# Patient Record
Sex: Male | Born: 1980 | Race: Black or African American | Hispanic: No | Marital: Single | Smoking: Never smoker
Health system: Southern US, Community
[De-identification: ages and names within clinical notes are randomized; demographics above are authoritative.]

## PROBLEM LIST (undated history)

## (undated) DIAGNOSIS — I10 Essential (primary) hypertension: Secondary | ICD-10-CM

## (undated) DIAGNOSIS — IMO0001 Reserved for inherently not codable concepts without codable children: Secondary | ICD-10-CM

## (undated) HISTORY — PX: TONSILLECTOMY: SUR1361

---
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
## 2002-01-12 ENCOUNTER — Encounter: Payer: Self-pay | Admitting: Emergency Medicine

## 2002-01-12 ENCOUNTER — Emergency Department (HOSPITAL_COMMUNITY): Admission: EM | Admit: 2002-01-12 | Discharge: 2002-01-12 | Payer: Self-pay | Admitting: Emergency Medicine

## 2002-09-12 ENCOUNTER — Emergency Department (HOSPITAL_COMMUNITY): Admission: EM | Admit: 2002-09-12 | Discharge: 2002-09-12 | Payer: Self-pay | Admitting: *Deleted

## 2002-09-12 ENCOUNTER — Encounter: Payer: Self-pay | Admitting: *Deleted

## 2004-01-08 ENCOUNTER — Emergency Department (HOSPITAL_COMMUNITY): Admission: EM | Admit: 2004-01-08 | Discharge: 2004-01-08 | Payer: Self-pay | Admitting: Emergency Medicine

## 2004-01-09 ENCOUNTER — Emergency Department (HOSPITAL_COMMUNITY): Admission: EM | Admit: 2004-01-09 | Discharge: 2004-01-09 | Payer: Self-pay | Admitting: Emergency Medicine

## 2004-10-03 ENCOUNTER — Emergency Department (HOSPITAL_COMMUNITY): Admission: EM | Admit: 2004-10-03 | Discharge: 2004-10-03 | Payer: Self-pay | Admitting: Emergency Medicine

## 2005-03-23 ENCOUNTER — Emergency Department (HOSPITAL_COMMUNITY): Admission: EM | Admit: 2005-03-23 | Discharge: 2005-03-24 | Payer: Self-pay | Admitting: Emergency Medicine

## 2005-06-18 ENCOUNTER — Emergency Department (HOSPITAL_COMMUNITY): Admission: EM | Admit: 2005-06-18 | Discharge: 2005-06-19 | Payer: Self-pay | Admitting: Emergency Medicine

## 2005-08-17 ENCOUNTER — Emergency Department (HOSPITAL_COMMUNITY): Admission: EM | Admit: 2005-08-17 | Discharge: 2005-08-17 | Payer: Self-pay | Admitting: Emergency Medicine

## 2005-08-20 ENCOUNTER — Emergency Department (HOSPITAL_COMMUNITY): Admission: EM | Admit: 2005-08-20 | Discharge: 2005-08-20 | Payer: Self-pay | Admitting: Emergency Medicine

## 2006-04-30 ENCOUNTER — Emergency Department (HOSPITAL_COMMUNITY): Admission: EM | Admit: 2006-04-30 | Discharge: 2006-04-30 | Payer: Self-pay | Admitting: Emergency Medicine

## 2006-05-01 ENCOUNTER — Emergency Department: Payer: Self-pay | Admitting: Emergency Medicine

## 2006-05-02 ENCOUNTER — Emergency Department (HOSPITAL_COMMUNITY): Admission: EM | Admit: 2006-05-02 | Discharge: 2006-05-02 | Payer: Self-pay | Admitting: Emergency Medicine

## 2006-05-04 ENCOUNTER — Emergency Department (HOSPITAL_COMMUNITY): Admission: EM | Admit: 2006-05-04 | Discharge: 2006-05-05 | Payer: Self-pay | Admitting: Emergency Medicine

## 2006-06-01 ENCOUNTER — Emergency Department (HOSPITAL_COMMUNITY): Admission: EM | Admit: 2006-06-01 | Discharge: 2006-06-01 | Payer: Self-pay | Admitting: Emergency Medicine

## 2006-06-30 ENCOUNTER — Emergency Department (HOSPITAL_COMMUNITY): Admission: EM | Admit: 2006-06-30 | Discharge: 2006-07-01 | Payer: Self-pay | Admitting: Emergency Medicine

## 2006-09-06 ENCOUNTER — Emergency Department (HOSPITAL_COMMUNITY): Admission: EM | Admit: 2006-09-06 | Discharge: 2006-09-06 | Payer: Self-pay | Admitting: Emergency Medicine

## 2006-10-06 ENCOUNTER — Emergency Department (HOSPITAL_COMMUNITY): Admission: EM | Admit: 2006-10-06 | Discharge: 2006-10-07 | Payer: Self-pay | Admitting: Emergency Medicine

## 2006-11-17 ENCOUNTER — Emergency Department (HOSPITAL_COMMUNITY): Admission: EM | Admit: 2006-11-17 | Discharge: 2006-11-17 | Payer: Self-pay | Admitting: Emergency Medicine

## 2007-02-10 ENCOUNTER — Emergency Department (HOSPITAL_COMMUNITY): Admission: EM | Admit: 2007-02-10 | Discharge: 2007-02-10 | Payer: Self-pay | Admitting: Emergency Medicine

## 2007-03-22 ENCOUNTER — Observation Stay (HOSPITAL_COMMUNITY): Admission: EM | Admit: 2007-03-22 | Discharge: 2007-03-23 | Payer: Self-pay | Admitting: Emergency Medicine

## 2007-03-22 ENCOUNTER — Encounter (INDEPENDENT_AMBULATORY_CARE_PROVIDER_SITE_OTHER): Payer: Self-pay | Admitting: *Deleted

## 2007-03-22 ENCOUNTER — Ambulatory Visit: Payer: Self-pay | Admitting: Cardiology

## 2007-05-10 ENCOUNTER — Emergency Department (HOSPITAL_COMMUNITY): Admission: EM | Admit: 2007-05-10 | Discharge: 2007-05-10 | Payer: Self-pay | Admitting: Emergency Medicine

## 2007-09-05 ENCOUNTER — Emergency Department (HOSPITAL_COMMUNITY): Admission: EM | Admit: 2007-09-05 | Discharge: 2007-09-05 | Payer: Self-pay | Admitting: Emergency Medicine

## 2008-06-02 ENCOUNTER — Emergency Department (HOSPITAL_COMMUNITY): Admission: EM | Admit: 2008-06-02 | Discharge: 2008-06-02 | Payer: Self-pay | Admitting: Emergency Medicine

## 2008-06-04 ENCOUNTER — Emergency Department (HOSPITAL_COMMUNITY): Admission: EM | Admit: 2008-06-04 | Discharge: 2008-06-04 | Payer: Self-pay | Admitting: Emergency Medicine

## 2008-07-15 ENCOUNTER — Emergency Department (HOSPITAL_COMMUNITY): Admission: EM | Admit: 2008-07-15 | Discharge: 2008-07-15 | Payer: Self-pay | Admitting: Emergency Medicine

## 2008-08-09 ENCOUNTER — Emergency Department: Payer: Self-pay | Admitting: Internal Medicine

## 2008-08-12 ENCOUNTER — Emergency Department (HOSPITAL_COMMUNITY): Admission: EM | Admit: 2008-08-12 | Discharge: 2008-08-12 | Payer: Self-pay | Admitting: Emergency Medicine

## 2008-09-29 ENCOUNTER — Emergency Department (HOSPITAL_COMMUNITY): Admission: EM | Admit: 2008-09-29 | Discharge: 2008-09-29 | Payer: Self-pay | Admitting: Emergency Medicine

## 2008-10-04 ENCOUNTER — Emergency Department: Payer: Self-pay | Admitting: Emergency Medicine

## 2008-10-09 ENCOUNTER — Emergency Department: Payer: Self-pay | Admitting: Internal Medicine

## 2009-01-30 ENCOUNTER — Emergency Department: Payer: Self-pay | Admitting: Emergency Medicine

## 2009-02-13 ENCOUNTER — Emergency Department (HOSPITAL_COMMUNITY): Admission: EM | Admit: 2009-02-13 | Discharge: 2009-02-13 | Payer: Self-pay | Admitting: Emergency Medicine

## 2009-02-13 ENCOUNTER — Encounter: Payer: Self-pay | Admitting: Internal Medicine

## 2009-02-18 ENCOUNTER — Emergency Department: Payer: Self-pay | Admitting: Emergency Medicine

## 2009-03-01 ENCOUNTER — Emergency Department: Payer: Self-pay | Admitting: Emergency Medicine

## 2009-04-07 ENCOUNTER — Emergency Department: Payer: Self-pay | Admitting: Internal Medicine

## 2009-04-13 ENCOUNTER — Emergency Department: Payer: Self-pay | Admitting: Emergency Medicine

## 2009-05-16 ENCOUNTER — Emergency Department (HOSPITAL_COMMUNITY): Admission: EM | Admit: 2009-05-16 | Discharge: 2009-05-16 | Payer: Self-pay | Admitting: Emergency Medicine

## 2009-05-30 ENCOUNTER — Emergency Department: Payer: Self-pay | Admitting: Emergency Medicine

## 2009-06-05 ENCOUNTER — Emergency Department (HOSPITAL_COMMUNITY): Admission: EM | Admit: 2009-06-05 | Discharge: 2009-06-05 | Payer: Self-pay | Admitting: Emergency Medicine

## 2009-06-19 DIAGNOSIS — K219 Gastro-esophageal reflux disease without esophagitis: Secondary | ICD-10-CM

## 2009-06-19 DIAGNOSIS — E669 Obesity, unspecified: Secondary | ICD-10-CM

## 2009-06-19 DIAGNOSIS — I10 Essential (primary) hypertension: Secondary | ICD-10-CM

## 2009-06-20 ENCOUNTER — Ambulatory Visit: Payer: Self-pay | Admitting: Internal Medicine

## 2009-06-20 DIAGNOSIS — R002 Palpitations: Secondary | ICD-10-CM

## 2010-05-06 NOTE — Assessment & Plan Note (Signed)
Summary: np6/chest pain/self refer/jss   Visit Type:  new pt visit Primary Provider:  [facility]  CC:  palpitations.  History of Present Illness: Patient is a 30 year old who is self referred for evaluation of palpitations. the patient has had a long history of palpitations.  He was seen remotely by T. Wall several years ago when he was in the hospital.  He has also been evaluated at [facility].  An echo in the past was done which showed normal LV function.  Per his report  he is doing better on metoprolol.  He denies dizziness, no significant chest pain.  The palpitations are isolated and again, on metoprolol he is not bothered by them.  Current Medications (verified): 1)  Metoprolol Tartrate 25 Mg Tabs (Metoprolol Tartrate) .Marland Kitchen.. 1 Tab Qam..2 Tabs At Bedtime 2)  Nexium 40 Mg Cpdr (Esomeprazole Magnesium) .Marland Kitchen.. 1 Tab Once Daily 3)  Tylenol Extra Strength 500 Mg Tabs (Acetaminophen) .Marland Kitchen.. 1 Tab As Needed  Allergies (verified): No Known Drug Allergies  Past History:  Past Medical History: Current Problems:  OBESITY, UNSPECIFIED (ICD-278.00) ACID REFLUX DISEASE (ICD-530.81) HYPERTENSION (ICD-401.9) Palpitations Chest pain  Social History: non-smoker, non drinker , no drug abuse   Review of Systems       All systems reviewed.  Negatvie to the above problem except as noted above.  Vital Signs:  Patient profile:   30 year old male Height:      72 inches Weight:      540 pounds BMI:     73.50 Pulse rate:   100 / minute Pulse rhythm:   irregular BP sitting:   160 / 80  (left arm) Cuff size:   large  Vitals Entered By: Danielle Rankin, CMA (June 20, 2009 2:44 PM)  Physical Exam  Additional Exam:  Patent is a morbidly obese 30 year old in NAD HEENT:  Normocephalic, atraumatic. EOMI, PERRLA.  Neck: JVP is normal.  No bruits.  Lungs: clear to auscultation. No rales no wheezes.  Heart: Regular rate and rhythm. Normal S1, S2. No S3.   No significant murmurs. Distant.  Abdomen:  Distended.  Non tender.  No obvious masses.  Extremities:   Good distal pulses throughout. No lower extremity edema.  Musculoskeletal :moving all extremities.  Neuro:   alert and oriented x3.    EKG  Procedure date:  06/20/2009  Findings:      NSR. 100 bpm.  Impression & Recommendations:  Problem # 1:  PALPITATIONS (ICD-785.1) By review of the record he  has had a long standing history of isolated PVCs.  No sustained arrhythmia.  Normal LV I would continue metoprolol.  He is not having problems now.    Problem # 2:  HYPERTENSION (ICD-401.9) BP is high today.  I have suggested that he take 2 metoprolol in AM and 1 in PM.  Will need f/u.  He has been told he has sleep apnea.  He is not using CPAP,  which would probably help. His updated medication list for this problem includes:    Metoprolol Tartrate 25 Mg Tabs (Metoprolol tartrate) .Marland Kitchen... 1 tab qam..2 tabs at bedtime  Problem # 3:  OBESITY, UNSPECIFIED (ICD-278.00) I encouraged him to lose wt.  He is consdering bariatric surgery.  I would recomm.  He has failed ther wt loss attempts.  From a cardiac standpt he should tolerate.  Patient Instructions: 1)  Continue current meds.  Can change metoprolol to 2 in AM and 1 in PM.  Will need f/u of bp in primary care. 2)  F/u cardiology as needed if palpitations cause symptoms.

## 2010-06-25 LAB — URINALYSIS, ROUTINE W REFLEX MICROSCOPIC
Ketones, ur: NEGATIVE mg/dL
Nitrite: NEGATIVE
Protein, ur: 100 mg/dL — AB
Specific Gravity, Urine: 1.027 (ref 1.005–1.030)
Urobilinogen, UA: 1 mg/dL (ref 0.0–1.0)
pH: 6 (ref 5.0–8.0)

## 2010-06-25 LAB — URINE MICROSCOPIC-ADD ON

## 2010-06-27 LAB — COMPREHENSIVE METABOLIC PANEL
AST: 14 U/L (ref 0–37)
BUN: 10 mg/dL (ref 6–23)
CO2: 29 mEq/L (ref 19–32)
Creatinine, Ser: 0.84 mg/dL (ref 0.4–1.5)
GFR calc Af Amer: >60 mL/min (ref >60)
Glucose, Bld: 112 mg/dL — ABNORMAL HIGH (ref 70–99)
Potassium: 4.2 mEq/L (ref 3.5–5.1)
Sodium: 139 mEq/L (ref 135–145)
Total Protein: 6.8 g/dL (ref 6.0–8.3)

## 2010-06-27 LAB — DIFFERENTIAL
Basophils Relative: 1 % (ref 0–1)
Eosinophils Absolute: 0.2 10*3/uL (ref 0.0–0.7)
Monocytes Absolute: 0.5 10*3/uL (ref 0.1–1.0)
Monocytes Relative: 7 % (ref 3–12)
Neutro Abs: 3.9 10*3/uL (ref 1.7–7.7)
Neutrophils Relative %: 54 % (ref 43–77)

## 2010-07-09 LAB — DIFFERENTIAL
Basophils Absolute: 0.1 10*3/uL (ref 0.0–0.1)
Eosinophils Absolute: 0.2 10*3/uL (ref 0.0–0.7)
Eosinophils Relative: 3 % (ref 0–5)
Lymphocytes Relative: 41 % (ref 12–46)
Neutrophils Relative %: 47 % (ref 43–77)

## 2010-07-09 LAB — POCT CARDIAC MARKERS: Troponin i, poc: <0.05 ng/mL (ref 0.00–0.09)

## 2010-07-09 LAB — CBC
Hemoglobin: 13.1 g/dL (ref 13.0–17.0)
MCHC: 32.3 g/dL (ref 30.0–36.0)
MCV: 79.4 fL (ref 78.0–100.0)
RBC: 5.11 MIL/uL (ref 4.22–5.81)
RDW: 16.4 % — ABNORMAL HIGH (ref 11.5–15.5)
WBC: 6.4 10*3/uL (ref 4.0–10.5)

## 2010-07-09 LAB — BASIC METABOLIC PANEL
Calcium: 9.5 mg/dL (ref 8.4–10.5)
Chloride: 100 mEq/L (ref 96–112)
GFR calc non Af Amer: >60 mL/min (ref >60)
Glucose, Bld: 122 mg/dL — ABNORMAL HIGH (ref 70–99)

## 2010-07-14 LAB — POCT CARDIAC MARKERS: Myoglobin, poc: 184 ng/mL (ref 12–200)

## 2010-07-14 LAB — DIFFERENTIAL
Basophils Absolute: 0.1 10*3/uL (ref 0.0–0.1)
Basophils Relative: 1 % (ref 0–1)
Eosinophils Absolute: 0.2 10*3/uL (ref 0.0–0.7)
Neutrophils Relative %: 49 % (ref 43–77)

## 2010-07-14 LAB — CBC
MCHC: 32.2 g/dL (ref 30.0–36.0)
MCV: 79.7 fL (ref 78.0–100.0)
Platelets: 304 10*3/uL (ref 150–400)

## 2010-07-14 LAB — BASIC METABOLIC PANEL
BUN: 10 mg/dL (ref 6–23)
CO2: 29 mEq/L (ref 19–32)
Chloride: 105 mEq/L (ref 96–112)
Creatinine, Ser: 0.94 mg/dL (ref 0.4–1.5)

## 2010-07-15 LAB — URINALYSIS, ROUTINE W REFLEX MICROSCOPIC
Bilirubin Urine: NEGATIVE
Glucose, UA: NEGATIVE mg/dL
Hgb urine dipstick: NEGATIVE
Ketones, ur: NEGATIVE mg/dL
Nitrite: NEGATIVE
Protein, ur: NEGATIVE mg/dL
Specific Gravity, Urine: 1.025 (ref 1.005–1.030)
Urobilinogen, UA: 0.2 mg/dL (ref 0.0–1.0)
pH: 6 (ref 5.0–8.0)

## 2010-07-15 LAB — URINE CULTURE
Colony Count: NO GROWTH
Culture: NO GROWTH

## 2010-07-16 LAB — POCT I-STAT, CHEM 8
BUN: 9 mg/dL (ref 6–23)
Calcium, Ion: 1.13 mmol/L (ref 1.12–1.32)
Chloride: 104 mEq/L (ref 96–112)
Creatinine, Ser: 0.8 mg/dL (ref 0.4–1.5)
Glucose, Bld: 109 mg/dL — ABNORMAL HIGH (ref 70–99)
HCT: 40 % (ref 39.0–52.0)
Hemoglobin: 13.6 g/dL (ref 13.0–17.0)
Potassium: 4.1 mEq/L (ref 3.5–5.1)
Sodium: 138 mEq/L (ref 135–145)
TCO2: 28 mmol/L (ref 0–100)

## 2010-08-19 NOTE — H&P (Signed)
NAME:  Nathaniel Velasquez, Nathaniel Velasquez                ACCOUNT NO.:  [id number]   MEDICAL RECORD NO.:  [id number]          PATIENT TYPE:  INP   LOCATION:  4732                         FACILITY:  [facility]   PHYSICIAN:  Wilson Singer, M.D.DATE OF BIRTH:  [birth year]   DATE OF ADMISSION:  03/22/2007  DATE OF DISCHARGE:                              HISTORY & PHYSICAL   HISTORY:  This is a 30 year old man who gives a two-week history of  intermittent chest tightness at rest, for the last two weeks.  The  episodes usually last for 30 minutes.  Interestingly enough, the chest  tightness does not seem to appear on exertion.  He denies any trauma to  his chest.  There is no significant cough or fever.  He has been seen in  the emergency room on more than one occasion for chest pain, and it was  felt that he had costochondritis.  He does have a history of  hypertension and he is morbidly obese.   PAST MEDICAL HISTORY:  1. Hypertension.  2. Sleep apnea.   PAST SURGICAL HISTORY:  No operations.   SOCIAL HISTORY:  He is single.  Lives with his mother.  He does not  smoke.  Does not drink alcohol.  He works at a nursing home, Energy manager.   CURRENT MEDICATIONS:  Norvasc 5 mg daily.   ALLERGIES:  PROFED, which produces hives.   PHYSICAL EXAMINATION:  VITAL SIGNS:  Temperature 97.1 degrees, blood  pressure 149/71, pulse 78, saturation 100%, respirations 12-14.  The  patient weighs 530 pounds, according to his most recent weight.  CARDIOVASCULAR:  Heart sounds are present and normal, without a gallop  rhythm.  There are no murmurs.  LUNGS:  Lung fields are clear.  ABDOMEN:  Soft, nontender with no hepatosplenomegaly.  NEUROLOGIC:  Alert and oriented, without any focal neurological signs.   INVESTIGATIONS:  Electrocardiogram shows some T-wave changes in the  inferior leads which are new, compared to an electrocardiogram done in  August of this year, but there are no acute ST-T elevations.   Troponin less than 0.05.  Sodium 138, potassium 3.9, bicarbonate 27,  glucose 98, BUN 10, creatinine 0.88.  Hemoglobin 12.5, white blood cell  count 10.1, platelets 353.   Chest x-ray was unremarkable and essentially normal.   IMPRESSION:  1. Chest tightness, rule out ischemia.  2. Hypertension.  3. Morbid obesity.  4. Sleep apnea.   PLAN:  1. Admit.  2. Serial cardiac enzymes and electrocardiograms.  3. Consider a cardiology consultation.  4. Dietary consultation with the need to try to lose weight, which is      his main problem.  5. Further recommendations will depend upon the patient's hospital      progress.      Wilson Singer, M.D.  Electronically Signed     NCG/MEDQ  D:  03/22/2007  T:  03/22/2007  Job:  [id number]

## 2010-08-19 NOTE — Consult Note (Signed)
NAME:  Nathaniel Velasquez, Nathaniel Velasquez                ACCOUNT NO.:  [id number]   MEDICAL RECORD NO.:  [id number]          PATIENT TYPE:  INP   LOCATION:  4732                         FACILITY:  [facility]   PHYSICIAN:  Jesse Sans. Wall, MD, FACCDATE OF BIRTH:  15-Aug-1980   DATE OF CONSULTATION:  03/22/2007  DATE OF DISCHARGE:                                 CONSULTATION   PRIMARY CARE PHYSICIAN:  Dr. Marline Backbone at [facility] in [place].   PRIMARY CARDIOLOGIST:  New.   CHIEF COMPLAINT:  Chest pain/abnormal EKG.   HISTORY OF PRESENT ILLNESS:  Nathaniel Velasquez is a 30 year old male with no  known history of coronary artery disease.  He complains of about a 10-  day history of almost constant chest pain.  He has had some exertional  symptoms but he has had more frequent symptoms that occur at rest.  He  feels that his symptoms are helped by him being in an upright position.  He describes the pain is a squeezing or a heaviness.  It starts in his  throat and goes down to the epigastric region.  Occasionally it is  associated with shortness of breath and diaphoresis.  He is also had  nausea with this fairly often.  He feels that his symptoms ease off  after he takes his blood pressure medication and stay that way for  awhile but then come back.  A couple of times it has kept him awake.  The pain range is a 5-8/10.  Occasionally he is pain-free but feels that  he is having pain most of the time.  Currently he states that the pain  is a 4 or 5/10.  It is the same pain that he has had in the midline of  his chest.  He has not tried any medications such as Tums or antacids  for this.  Prior to 10 days ago he had not experienced this pain.   PAST MEDICAL HISTORY:  1. Hypertension.  2. Morbid obesity.  3. Recently-diagnosed hyperlipidemia.  4. He has no history of diabetes or family history of coronary artery      disease.  5. Obstructive sleep apnea but he is noncompliant with CPAP  secondary      to headache.   SURGICAL HISTORY:  Tonsillectomy.   ALLERGIES:  PROFED.   Medications prior to admission were Norvasc 5 mg a day and now and  include DVT Lovenox at 120 mg daily.   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  He feels that he  is active with hunting and cutting and splinting wood because they heat  with wood.  He also helps out his neighbors cutting wood for them.   FAMILY HISTORY:  His mother is in her 63s and his father is in his 110s,  and neither one has a history of coronary artery disease.  He has no  siblings with CAD.   REVIEW OF SYSTEMS:  He had a headache secondary to CPAP  but this  resolved when he quit using it.  The chest pain and shortness of breath  are described above.  He denies dyspnea on exertion and states in order  to hunt he was climbing a tree the other day without significant  difficulty.  He states that he feels congested and feels that if he  could cough up something his pain would resolve, but he has coughed  occasionally but not coughed up anything except a little bit of white  phlegm.  He denies syncope or presyncope.  He denies claudication  symptoms or wheezing.  He has no palpitations.  Because of his morbid  obesity, he is being referred for consideration of gastric bypass.  He  says that he has occasional reflux symptoms but denies abdominal pain,  hematemesis, hemoptysis or melena.  He does not feel that these symptoms  are reflux.  A full 14-point review of systems is otherwise negative.   PHYSICAL EXAM:  VITAL SIGNS:  Temperature is 97.8, blood pressure  150/83, pulse 50, respiratory rate 24, O2 saturation 98% on 2 L.  Weight  is 242.6 kg, which is 533.7 pounds.  GENERAL:  He is a well-developed, obese African American male in no  acute distress.  HEENT:  Normal.  NECK:  There is no lymphadenopathy, thyromegaly, bruit or JVD noted.   CV:  His heart is regular in rate and rhythm with an S1, S2, and no  significant murmur, rub or gallop is noted.  Distal pulses are intact in  all four extremities.  LUNGS:  Clear to auscultation bilaterally.  SKIN:  No rashes or lesions are noted.  ABDOMEN:  Soft and nontender with active bowel sounds.  EXTREMITIES:  He has trace to 1+ edema bilaterally but no cyanosis or  clubbing is noted.  MUSCULOSKELETAL:  There is no joint deformity or effusion and no spine  or CVA tenderness.  NEUROLOGIC:  He is alert and oriented.  Cranial nerves II-XII grossly  intact.   Chest x-ray:  No acute disease.   EKG is sinus rhythm, rate 76, with inferior T-wave changes that are  slight but present compared to an EKG dated August 2008.   LABORATORY VALUES:  Hemoglobin 12.5, hematocrit 38, WBC 7.1, platelets  331.  Sodium 138, potassium 3.9, chloride 105, CO2 27, BUN 10,  creatinine 0.88, glucose 98.  Point of care markers negative x1.  CK, MB  and troponin I negative x2.  Hemoglobin A1c 5.8.  Total cholesterol 179,  triglycerides 98, HDL 25 LDL 134.  D-dimer less than 0.22.   IMPRESSION:  Chest pain:  He has noncardiac chest pain by careful  history as it is constant and frequently worse at rest.  He also has  deconditioning which is chronic and unchanged recently and secondary to  his weight and deconditioning.  Cardiac CT was considered but cannot be  performed here.  An echocardiogram has been ordered and results are  pending at the time of dictation.  If the echocardiogram is unhelpful  secondary to technical difficulties, a MUGA scan can be performed.  If  his EF is normal and there are no wall motion abnormalities, no further  cardiac workup is indicated at this time.  A long discussion was held by  Dr. Daleen Squibb with the patient and his mother on cardiac risk factor  reduction.  He is appropriately being referred for weight loss and  bariatric surgery.     Theodore Demark, PA-C       Jesse Sans.  Daleen Squibb, MD, [facility]  Electronically Signed   RB/MEDQ  D:  03/22/2007  T:  03/23/2007  Job:  [id number]   cc:   Marline Backbone, MD

## 2010-08-19 NOTE — Discharge Summary (Signed)
[name], [name] NO.:  [id number]   MEDICAL RECORD NO.:  [id number]          PATIENT TYPE:  INP   LOCATION:  4732                         FACILITY:  [facility]   PHYSICIAN:  Lucita Ferrara, MD         DATE OF BIRTH:  22-Oct-1980   DATE OF ADMISSION:  03/22/2007  DATE OF DISCHARGE:  03/23/2007                               DISCHARGE SUMMARY   ADMISSION DIAGNOSES:  1. Chest tightness, rule out ischemia.  2. Hypertension.  3. Morbid obesity.  4. Sleep apnea.   DISCHARGE DIAGNOSES:  1. Chest tightness, ischemia ruled out.  2. Hypertension, chronic, stable.  3. Morbid obesity, chronic, ongoing problem.  4. Sleep apnea, noncompliant on continuous positive airway pressure.   PROCEDURES:  The patient had a 2-D echocardiogram March 22, 2007,  which showed __________ to be technically limited but overall left  ventricular function is not significantly reduced.  Left ventricular  wall might be mildly increased.  Right ventricle is normal.   CONSULTANTS:  [facility].   BRIEF HISTORY OF PRESENT ILLNESS:  Nathaniel Velasquez is a 30 year old  morbidly obese patient who presents to [facility] on  March 22, 2007, after having chest tightness at rest that has been  going on for the last 2 or 3 weeks, lasting 30 minutes.  No orthopnea or  paroxysmal nocturnal dyspnea.  No worsening of the symptom upon  exertion.  He was admitted for chest tightness and serial enzymes x3.  Electrocardiogram was within normal limits.  Cardiology was consulted  and they recommended the patient to get an echocardiogram.  Stress test  was not advised.  Also risk factor modification with lipid-lowering  agents.  A fasting lipid profile was checked and overall LDL was  increased at 134.   Problem 2.  HISTORY OF SLEEP APNEA, NONCOMPLIANT WITH CONTINUOUS  POSITIVE AIRWAY PRESSURE:  Apparently the patient has been evaluated in  [facility].  The patient is noncompliant with CPAP  as he cannot  tolerate it at night.  The patient was advised and encouraged to  continue on his CPAP.  Overall overnight pulse oximetry did not  desaturate.   Problem 3.  MORBID OBESITY:  Nutrition was called and the patient had a  long discussion in regard to diet and exercise and potential outpatient  referral for weight-lowering surgery including banding versus gastric  bypass.  The patient is still pretty contemplative in regard to this and  has not been evaluated by a certified nutritionist/dietetic clinic.  I  advised him and urged him to continue on with this.   On day of discharge the patient is hemodynamically stable.  VITAL SIGNS:  Temperature 98, pulse 69, respirations 20, blood pressure  150/89, pulse oximetry 97% on room air.  HEENT:  Normocephalic, atraumatic.  Sclerae anicteric.  NECK:  Supple.  No JVD or carotid bruits.  CARDIOVASCULAR:  S1, S2, regular rate and rhythm.  No murmurs, rubs or  clicks.  ABDOMEN:  Soft, not tender, not distended, positive bowel sounds.  CHEST:  Clear to auscultation bilaterally.  No  rhonchi, rales or  wheezes.  EXTREMITIES:  No clubbing, cyanosis, or edema.  Pulse oximetry 97% on  room air.   The patient will be discharged on:  1. Aspirin 81 mg p.o. daily.  2. Norvasc 10 mg p.o. daily.      Lucita Ferrara, MD  Electronically Signed     RR/MEDQ  D:  03/23/2007  T:  03/23/2007  Job:  [id number]

## 2010-08-22 NOTE — Procedures (Signed)
   NAME:  Nathaniel Velasquez, Nathaniel Velasquez                          ACCOUNT NO.:  [id number]   MEDICAL RECORD NO.:  [id number]                   PATIENT TYPE:  EMS   LOCATION:  ED                                   FACILITY:  [facility]   PHYSICIAN:  Edward L. Juanetta Gosling, M.D.             DATE OF BIRTH:  1981-02-22   DATE OF PROCEDURE:  01/12/2002  DATE OF DISCHARGE:  01/12/2002                                EKG INTERPRETATION   DATE AND TIME OF TEST:  January 12, 2002 at 1701.   IMPRESSION:  The rhythm is sinus rhythm with a rate in the 80s.  There are  small inferior Q waves and there are mild ST abnormalities inferiorly and  laterally.                                               Edward L. Juanetta Gosling, M.D.    ELH/MEDQ  D:  01/14/2002  T:  01/16/2002  Job:  [id number]

## 2010-10-13 ENCOUNTER — Emergency Department: Payer: Self-pay | Admitting: Emergency Medicine

## 2010-12-26 LAB — DIFFERENTIAL
Lymphocytes Relative: 46
Lymphs Abs: 4.5 — ABNORMAL HIGH
Neutro Abs: 4.2
Neutrophils Relative %: 44

## 2010-12-26 LAB — POCT CARDIAC MARKERS
CKMB, poc: 2.1
Myoglobin, poc: 89
Operator id: 198161
Operator id: 198161
Troponin i, poc: <0.05

## 2010-12-26 LAB — BASIC METABOLIC PANEL
BUN: 11
Calcium: 9.1
Creatinine, Ser: 0.84
GFR calc non Af Amer: >60
Potassium: 3.9

## 2010-12-26 LAB — B-NATRIURETIC PEPTIDE (CONVERTED LAB): Pro B Natriuretic peptide (BNP): <30

## 2010-12-26 LAB — CBC
HCT: 39.3
Platelets: 345
WBC: 9.7

## 2011-01-01 LAB — STREP A DNA PROBE: Group A Strep Probe: NEGATIVE

## 2011-01-09 LAB — COMPREHENSIVE METABOLIC PANEL
ALT: 17
AST: 18
Albumin: 3.4 — ABNORMAL LOW
Alkaline Phosphatase: 62
BUN: 7
CO2: 28
Calcium: 9.3
Chloride: 102
Creatinine, Ser: 0.8
GFR calc Af Amer: >60
GFR calc non Af Amer: >60
Glucose, Bld: 91
Potassium: 4.1
Sodium: 139
Total Bilirubin: 0.7
Total Protein: 7.3

## 2011-01-09 LAB — POCT CARDIAC MARKERS
CKMB, poc: 1.4
CKMB, poc: 2.6
Myoglobin, poc: 102
Myoglobin, poc: 119
Operator id: 208821
Operator id: 208821
Troponin i, poc: <0.05
Troponin i, poc: <0.05

## 2011-01-09 LAB — D-DIMER, QUANTITATIVE (NOT AT ARMC): D-Dimer, Quant: <0.22

## 2011-01-09 LAB — CBC
HCT: 38.1 — ABNORMAL LOW
HCT: 38.8 — ABNORMAL LOW
Hemoglobin: 12.5 — ABNORMAL LOW
Hemoglobin: 12.5 — ABNORMAL LOW
MCHC: 32.3
MCHC: 32.7
MCV: 77.6 — ABNORMAL LOW
MCV: 79
Platelets: 331
Platelets: 353
RBC: 4.91
RBC: 4.91
RDW: 15.7 — ABNORMAL HIGH
RDW: 15.8 — ABNORMAL HIGH
WBC: 10.1
WBC: 7.1

## 2011-01-09 LAB — BASIC METABOLIC PANEL
BUN: 10
CO2: 27
Calcium: 9
Chloride: 105
Creatinine, Ser: 0.88
GFR calc Af Amer: >60
GFR calc non Af Amer: >60
Glucose, Bld: 98
Potassium: 3.9
Sodium: 138

## 2011-01-09 LAB — CARDIAC PANEL(CRET KIN+CKTOT+MB+TROPI)
CK, MB: 1.5
CK, MB: 1.8
CK, MB: 2
Relative Index: 0.7
Relative Index: 0.8
Relative Index: 0.9
Total CK: 205
Total CK: 211
Total CK: 217
Troponin I: 0.01
Troponin I: 0.03
Troponin I: 0.03

## 2011-01-09 LAB — DIFFERENTIAL
Basophils Absolute: 0
Basophils Relative: 0
Eosinophils Absolute: 0.3
Eosinophils Relative: 3
Lymphocytes Relative: 43
Lymphs Abs: 4.3 — ABNORMAL HIGH
Monocytes Absolute: 0.8
Monocytes Relative: 8
Neutro Abs: 4.7
Neutrophils Relative %: 46

## 2011-01-09 LAB — LIPID PANEL
Cholesterol: 179
HDL: 25 — ABNORMAL LOW
LDL Cholesterol: 134 — ABNORMAL HIGH
Total CHOL/HDL Ratio: 7.2
Triglycerides: 98
VLDL: 20

## 2011-01-09 LAB — HEMOGLOBIN A1C
Hgb A1c MFr Bld: 5.8
Mean Plasma Glucose: 129

## 2011-01-09 LAB — TSH: TSH: 3.733

## 2011-01-19 LAB — I-STAT 8, (EC8 V) (CONVERTED LAB)
BUN: 8
Bicarbonate: 25.4 — ABNORMAL HIGH
Chloride: 104
Glucose, Bld: 88
HCT: 43
Hemoglobin: 14.6
Operator id: 285491
Potassium: 4.1
Sodium: 138
TCO2: 27
pCO2, Ven: 42 — ABNORMAL LOW
pH, Ven: 7.389 — ABNORMAL HIGH

## 2011-01-19 LAB — BASIC METABOLIC PANEL
BUN: 8
CO2: 27
Calcium: 8.8
Chloride: 103
Creatinine, Ser: 0.72
GFR calc Af Amer: >60
GFR calc non Af Amer: >60
Glucose, Bld: 84
Potassium: 4
Sodium: 136

## 2011-01-19 LAB — D-DIMER, QUANTITATIVE: D-Dimer, Quant: 0.25

## 2011-01-19 LAB — POCT CARDIAC MARKERS
CKMB, poc: <1 — ABNORMAL LOW
Myoglobin, poc: 101
Operator id: 285491
Troponin i, poc: <0.05

## 2011-01-19 LAB — POCT I-STAT CREATININE
Creatinine, Ser: 0.8
Operator id: 285491

## 2011-01-19 LAB — B-NATRIURETIC PEPTIDE (CONVERTED LAB): Pro B Natriuretic peptide (BNP): <30

## 2011-01-20 LAB — I-STAT 8, (EC8 V) (CONVERTED LAB)
Acid-Base Excess: 1
BUN: 12
Bicarbonate: 26.7 — ABNORMAL HIGH
Chloride: 104
Glucose, Bld: 109 — ABNORMAL HIGH
HCT: 45
Hemoglobin: 15.3
Operator id: 277751
Potassium: 4
Sodium: 139
TCO2: 28
pCO2, Ven: 46.2
pH, Ven: 7.37 — ABNORMAL HIGH

## 2011-01-20 LAB — POCT CARDIAC MARKERS
CKMB, poc: <1 — ABNORMAL LOW
Myoglobin, poc: 59.3
Operator id: 277751
Troponin i, poc: <0.05

## 2011-01-20 LAB — POCT I-STAT CREATININE
Creatinine, Ser: 0.9
Operator id: 277751

## 2011-01-22 LAB — DIFFERENTIAL
Basophils Absolute: 0.1
Basophils Relative: 1
Eosinophils Absolute: 0.1
Eosinophils Relative: 2
Lymphocytes Relative: 34
Lymphs Abs: 2.6
Monocytes Absolute: 0.5
Monocytes Relative: 6
Neutro Abs: 4.3
Neutrophils Relative %: 57

## 2011-01-22 LAB — CBC
HCT: 37.8 — ABNORMAL LOW
Hemoglobin: 12.6 — ABNORMAL LOW
MCHC: 33.2
MCV: 77.4 — ABNORMAL LOW
Platelets: 365
RBC: 4.88
RDW: 15.9 — ABNORMAL HIGH
WBC: 7.6

## 2011-01-22 LAB — BASIC METABOLIC PANEL
BUN: 11
CO2: 29
Calcium: 9.2
Chloride: 99
Creatinine, Ser: 0.8
GFR calc Af Amer: >60
GFR calc non Af Amer: >60
Glucose, Bld: 95
Potassium: 4
Sodium: 135

## 2011-07-05 ENCOUNTER — Emergency Department: Payer: Self-pay | Admitting: Emergency Medicine

## 2011-11-29 ENCOUNTER — Encounter (HOSPITAL_COMMUNITY): Payer: Self-pay | Admitting: Emergency Medicine

## 2011-11-29 ENCOUNTER — Emergency Department (HOSPITAL_COMMUNITY)
Admission: EM | Admit: 2011-11-29 | Discharge: 2011-11-29 | Disposition: A | Discharge status: Home / Self Care | Payer: PRIVATE HEALTH INSURANCE | Attending: Emergency Medicine | Admitting: Emergency Medicine

## 2011-11-29 ENCOUNTER — Other Ambulatory Visit: Payer: Self-pay

## 2011-11-29 DIAGNOSIS — I10 Essential (primary) hypertension: Secondary | ICD-10-CM | POA: Insufficient documentation

## 2011-11-29 DIAGNOSIS — R000 Tachycardia, unspecified: Secondary | ICD-10-CM

## 2011-11-29 DIAGNOSIS — R197 Diarrhea, unspecified: Secondary | ICD-10-CM | POA: Insufficient documentation

## 2011-11-29 DIAGNOSIS — I498 Other specified cardiac arrhythmias: Secondary | ICD-10-CM | POA: Insufficient documentation

## 2011-11-29 HISTORY — DX: Essential (primary) hypertension: I10

## 2011-11-29 LAB — BASIC METABOLIC PANEL
BUN: 10 mg/dL (ref 6–23)
CO2: 26 mEq/L (ref 19–32)
Calcium: 9.3 mg/dL (ref 8.4–10.5)
Chloride: 92 mEq/L — ABNORMAL LOW (ref 96–112)
Creatinine, Ser: 0.75 mg/dL (ref 0.50–1.35)
GFR calc Af Amer: >90 mL/min (ref >90)
GFR calc non Af Amer: >90 mL/min (ref >90)
Glucose, Bld: 144 mg/dL — ABNORMAL HIGH (ref 70–99)
Potassium: 3.4 mEq/L — ABNORMAL LOW (ref 3.5–5.1)
Sodium: 129 mEq/L — ABNORMAL LOW (ref 135–145)

## 2011-11-29 LAB — URINALYSIS, ROUTINE W REFLEX MICROSCOPIC
Bilirubin Urine: NEGATIVE
Glucose, UA: NEGATIVE mg/dL
Hgb urine dipstick: NEGATIVE
Ketones, ur: NEGATIVE mg/dL
Leukocytes, UA: NEGATIVE
Nitrite: NEGATIVE
Protein, ur: NEGATIVE mg/dL
Specific Gravity, Urine: 1.02 (ref 1.005–1.030)
Urobilinogen, UA: 0.2 mg/dL (ref 0.0–1.0)
pH: 6 (ref 5.0–8.0)

## 2011-11-29 LAB — CBC
HCT: 36.7 % — ABNORMAL LOW (ref 39.0–52.0)
Hemoglobin: 11.9 g/dL — ABNORMAL LOW (ref 13.0–17.0)
MCH: 24.9 pg — ABNORMAL LOW (ref 26.0–34.0)
MCHC: 32.4 g/dL (ref 30.0–36.0)
MCV: 76.8 fL — ABNORMAL LOW (ref 78.0–100.0)
Platelets: 322 10*3/uL (ref 150–400)
RBC: 4.78 MIL/uL (ref 4.22–5.81)
RDW: 17 % — ABNORMAL HIGH (ref 11.5–15.5)
WBC: 15.8 10*3/uL — ABNORMAL HIGH (ref 4.0–10.5)

## 2011-11-29 MED ORDER — SODIUM CHLORIDE 0.9 % IV BOLUS (SEPSIS)
1000.0000 mL | Freq: Once | INTRAVENOUS | Status: AC
  Administered 2011-11-29: 1000 mL via INTRAVENOUS

## 2011-11-29 MED ORDER — OXYMETAZOLINE HCL 0.05 % NA SOLN: 2.0000 | Freq: Two times a day (BID) | NASAL | Status: AC

## 2011-11-29 MED ORDER — DIPHENOXYLATE-ATROPINE 2.5-0.025 MG PO TABS: 1.0000 | ORAL_TABLET | Freq: Four times a day (QID) | ORAL | Status: AC | PRN

## 2011-11-29 MED ORDER — POTASSIUM CHLORIDE CRYS ER 20 MEQ PO TBCR
40.0000 meq | EXTENDED_RELEASE_TABLET | Freq: Once | ORAL | Status: AC
  Administered 2011-11-29: 40 meq via ORAL
  Filled 2011-11-29: qty 2

## 2011-11-29 MED ORDER — KETOROLAC TROMETHAMINE 30 MG/ML IJ SOLN
30.0000 mg | Freq: Once | INTRAMUSCULAR | Status: AC
  Administered 2011-11-29: 30 mg via INTRAVENOUS
  Filled 2011-11-29: qty 1

## 2011-11-29 NOTE — ED Notes (Signed)
Pt requesting something for his headache, Dr. Juleen China notified, additional orders given

## 2011-11-29 NOTE — ED Notes (Signed)
Dr. Juleen China in room talking with pt and family

## 2011-11-29 NOTE — ED Notes (Signed)
Pt c/o diarrhea for the past three days, chills, headache, nausea that started today, chest pain at times.

## 2011-11-29 NOTE — ED Notes (Signed)
Patient with c/o nausea, dizziness, and chest pain that started today. Patient reports waking with symptoms this morning. Dizzy upon standing.

## 2011-11-29 NOTE — ED Provider Notes (Signed)
History   This chart was scribed for Raeford Razor, MD by Charolett Bumpers . The patient was seen in room APA14/APA14. Patient's care was started at 1217.    CSN: [id number]  Arrival date & time 11/29/11  1143   First MD Initiated Contact with Patient 11/29/11 1217      Chief Complaint  Patient presents with  . Dizziness  . Chills  . Urinary Frequency    (Consider location/radiation/quality/duration/timing/severity/associated sxs/prior treatment) HPI Nathaniel Velasquez is a 31 y.o. male who presents to the Emergency Department complaining of persistent, moderate diarrhea for the past 3 days. Pt reports 6-8 episodes of diarrhea each day. Pt reports associated chills, dizziness described as light-headedness, headache, nausea, mild abdominal discomfort andchest pain at times that started this morning. Pt denies any fevers, blood in stool, vomiting, back pain and urinary symptoms. Pt reports a possible sick contact with similar symptoms. Pt reports a h/o HTN.  Past Medical History  Diagnosis Date  . Hypertension     Past Surgical History  Procedure Date  . Tonsillectomy     No family history on file.  History  Substance Use Topics  . Smoking status: Never Smoker   . Smokeless tobacco: Not on file  . Alcohol Use: No      Review of Systems  Constitutional: Positive for chills. Negative for fever.  Cardiovascular: Positive for chest pain.  Gastrointestinal: Positive for nausea, abdominal pain and diarrhea. Negative for vomiting and blood in stool.  Genitourinary: Negative for dysuria and frequency.  Musculoskeletal: Negative for back pain.  Neurological: Positive for dizziness, light-headedness and headaches.  All other systems reviewed and are negative.    Allergies  Review of patient's allergies indicates no known allergies.  Home Medications  No current outpatient prescriptions on file.  BP 161/81  Pulse 132  Temp 98.1 F (36.7 C) (Oral)  Resp 18  Ht 6'  (1.829 m)  Wt 524 lb 6.4 oz (237.866 kg)  BMI 71.12 kg/m2  SpO2 100%  Physical Exam  Nursing note and vitals reviewed. Constitutional: He is oriented to person, place, and time. He appears well-developed and well-nourished. No distress.       Morbidly obese. No acute distress.   HENT:  Head: Normocephalic and atraumatic.  Eyes: Conjunctivae are normal. Pupils are equal, round, and reactive to light. Right eye exhibits no discharge. Left eye exhibits no discharge.  Neck: Neck supple.  Cardiovascular: Regular rhythm.  Tachycardia present.  Exam reveals no gallop and no friction rub.   Pulmonary/Chest: Effort normal and breath sounds normal. No respiratory distress. He has no wheezes.       Diminished breath sounds, but lungs are clear.   Abdominal: Soft. He exhibits no distension. There is no tenderness.  Musculoskeletal: He exhibits no edema and no tenderness.  Neurological: He is alert and oriented to person, place, and time. No cranial nerve deficit.       Cranial nerves intact. Strength 5/5 throughout. Finger to nose test normal.   Skin: Skin is warm and dry.  Psychiatric: He has a normal mood and affect. His behavior is normal. Thought content normal.    ED Course  Procedures (including critical care time)  DIAGNOSTIC STUDIES: Oxygen Saturation is 100% on room air, normal by my interpretation.    COORDINATION OF CARE:  12:30-Medication Orders: Sodium chloride 0.9% bolus 1,000 mL-once  12:47-Discussed planned course of treatment with the patient including IV fluids, blood work and UA, who is agreeable at this  time.   13:00-Medication Orders: Sodium chloride 0.9% bolus 1,000 mL-once  Results for orders placed during the hospital encounter of 11/29/11  CBC      Component Value Range   WBC 15.8 (*) 4.0 - 10.5 K/uL   RBC 4.78  4.22 - 5.81 MIL/uL   Hemoglobin 11.9 (*) 13.0 - 17.0 g/dL   HCT 84.6 (*) 96.2 - 95.2 %   MCV 76.8 (*) 78.0 - 100.0 fL   MCH 24.9 (*) 26.0 - 34.0 pg     MCHC 32.4  30.0 - 36.0 g/dL   RDW 84.1 (*) 32.4 - 40.1 %   Platelets 322  150 - 400 K/uL  BASIC METABOLIC PANEL      Component Value Range   Sodium 129 (*) 135 - 145 mEq/L   Potassium 3.4 (*) 3.5 - 5.1 mEq/L   Chloride 92 (*) 96 - 112 mEq/L   CO2 26  19 - 32 mEq/L   Glucose, Bld 144 (*) 70 - 99 mg/dL   BUN 10  6 - 23 mg/dL   Creatinine, Ser 0.27  0.50 - 1.35 mg/dL   Calcium 9.3  8.4 - 25.3 mg/dL   GFR calc non Af Amer >90  >90 mL/min   GFR calc Af Amer >90  >90 mL/min  URINALYSIS, ROUTINE W REFLEX MICROSCOPIC      Component Value Range   Color, Urine YELLOW  YELLOW   APPearance CLEAR  CLEAR   Specific Gravity, Urine 1.020  1.005 - 1.030   pH 6.0  5.0 - 8.0   Glucose, UA NEGATIVE  NEGATIVE mg/dL   Hgb urine dipstick NEGATIVE  NEGATIVE   Bilirubin Urine NEGATIVE  NEGATIVE   Ketones, ur NEGATIVE  NEGATIVE mg/dL   Protein, ur NEGATIVE  NEGATIVE mg/dL   Urobilinogen, UA 0.2  0.0 - 1.0 mg/dL   Nitrite NEGATIVE  NEGATIVE   Leukocytes, UA NEGATIVE  NEGATIVE     No results found.  EKG:  Rhythm: sinus tachycardia Vent. rate 131 BPM PR interval 150 ms QRS duration 86 ms QT/QTc 284/419 ms P-R-T axes 40 94 15 Axis: right Intervals: normal ST segments: NS ST changes inferiorly and lateraly   1. Diarrhea   2. Sinus tachycardia       MDM  31yM with dizziness. Suspect related to volume depletion from multiple episodes of diarrhea. Sinus tach which improved with IVF. Afebrile. Nonfocal neuro exam. Pt reports feeling much better. Consider PE but doubt. Return precautions discussed. Continue symptomatic tx. Outpt fu otherwise.  I personally preformed the services scribed in my presence. The recorded information has been reviewed and considered. Raeford Razor, MD.        Raeford Razor, MD 12/02/11 [phone]

## 2012-10-28 DIAGNOSIS — Z86718 Personal history of other venous thrombosis and embolism: Secondary | ICD-10-CM | POA: Insufficient documentation

## 2012-10-28 DIAGNOSIS — Z794 Long term (current) use of insulin: Secondary | ICD-10-CM | POA: Insufficient documentation

## 2013-03-17 ENCOUNTER — Ambulatory Visit: Payer: Medicare Other

## 2013-03-17 VITALS — BP 143/79 | HR 79 | Resp 12 | Ht 72.0 in | Wt >= 6400 oz

## 2013-03-17 DIAGNOSIS — L03039 Cellulitis of unspecified toe: Secondary | ICD-10-CM

## 2013-03-17 DIAGNOSIS — E119 Type 2 diabetes mellitus without complications: Secondary | ICD-10-CM

## 2013-03-17 DIAGNOSIS — L600 Ingrowing nail: Secondary | ICD-10-CM

## 2013-03-17 MED ORDER — CEPHALEXIN 500 MG PO CAPS: 500.0000 mg | ORAL_CAPSULE | Freq: Three times a day (TID) | ORAL | Status: DC

## 2013-03-17 MED ORDER — HYDROCODONE-ACETAMINOPHEN 5-325 MG PO TABS: 1.0000 | ORAL_TABLET | ORAL | Status: DC | PRN

## 2013-03-17 NOTE — Patient Instructions (Signed)

## 2013-03-17 NOTE — Progress Notes (Signed)
   Subjective:    Patient ID: Nathaniel Velasquez, male    DOB: Jan 01, 1981, 32 y.o.   MRN: [id number]  HPI Comments: '' LT FOOT PAIN GREAT TOENAIL IS HURTING.''  Toe Pain  The incident occurred more than 1 week ago. The incident occurred at home. The injury mechanism is unknown. The pain is present in the left toes. The quality of the pain is described as aching. The pain is at a severity of 8/10. The pain is moderate. The pain has been constant since onset. It is unknown if a foreign body is present. The symptoms are aggravated by weight bearing. He has tried NSAIDs for the symptoms. The treatment provided no relief.   patient received diagnosed with diabetes with the describes as borderline or prediabetes is taking metformin for his medications. Left great toe painful sore incurvated with granulation tissue proximal/medial border left great toe.    Review of Systems  All other systems reviewed and are negative.       Objective:   Physical Exam Neurovascular status is intact with pedal pulses palpable DP postal for PT plus one over 4 bilateral patient's mild edema should the patient is considerably overweight orthopedic biomechanical exam unremarkable noncontributory rectus foot type is noted mild digital contractures noted. Dermatologically there is crepitus incurvation of the hallux nail plate medial border left great toe with granulation tissue proximal/discharge bloody drainage of the medial nail fold. Neurovascular status is intact with pedal pulses palpable DP and PT +2/4 bilateral Refill time 3 seconds all digits. Epicritic sensations intact and symmetric bilateral normal plantar response DTRs not listed.       Assessment & Plan:  Assessment this time is paronychia ingrowing nail left great toe medial border recommendations for nail excision and phenol meniscectomy partial nail left great toe this time local anesthetic is administered Betadine prep was performed the left hallux nail plate  is incised in longitudinal fashion the medial nail border is excised phenol matricectomy followed by alcohol wash Betadine ointment and a dry sterile dressing being applied. Patient tolerated the procedure well and local block with 3 cc 50-50 mixture of 2% Xylocaine plain and 0.5 Marcaine plain had been infiltrated. Dressings applied patient is given instructions for daily soaking Betadine warm water prescription for cephalexin is dispensed recommended Tylenol as needed for pain and a prescription for hydrocodone is also dispensed recheck in 2-3 weeks for followup for nail check Allred instructions for soaking.  Alvan Dame DPM

## 2013-04-11 ENCOUNTER — Ambulatory Visit (INDEPENDENT_AMBULATORY_CARE_PROVIDER_SITE_OTHER): Payer: Medicare Other

## 2013-04-11 VITALS — BP 148/82 | HR 76 | Resp 12

## 2013-04-11 DIAGNOSIS — L03039 Cellulitis of unspecified toe: Secondary | ICD-10-CM

## 2013-04-11 DIAGNOSIS — L600 Ingrowing nail: Secondary | ICD-10-CM

## 2013-04-11 DIAGNOSIS — Z09 Encounter for follow-up examination after completed treatment for conditions other than malignant neoplasm: Secondary | ICD-10-CM

## 2013-04-11 NOTE — Progress Notes (Signed)
   Subjective:    Patient ID: Nathaniel Velasquez, male    DOB: 12/18/1980, 33 y.o.   MRN: [id number]  HPI Comments: '' LT GREAT TOENAIL IS DOING OK.''     Review of Systems no changes     Objective:   Physical Exam Neurovascular status is intact pedal pulses palpable history status post AP nail procedure medial border of left great toe. There is no apparent discharge drainage slight erythema noted been applying Neosporin and Band-Aid as instructed doing soaks this time Neosporin and Band-Aid applied maintain Neosporin and Band-Aid are and a letter dry at night       Assessment & Plan:  Assessment good postop progress following AP nail procedure there is no signs of infection keep the Band-Aid on during a letter dry at night discharge to an as-needed basis for followup. Patient may be candidate. for diabetic foot and palliative nail care in the future as needed.  Alvan Dameichard Abdoul Encinas DPM

## 2013-04-11 NOTE — Patient Instructions (Signed)

## 2013-12-01 ENCOUNTER — Ambulatory Visit (HOSPITAL_COMMUNITY)
Admission: RE | Admit: 2013-12-01 | Discharge: 2013-12-01 | Disposition: A | Discharge status: Home / Self Care | Payer: PRIVATE HEALTH INSURANCE | Source: Ambulatory Visit | Attending: Orthopaedic Surgery | Admitting: Orthopaedic Surgery

## 2013-12-01 ENCOUNTER — Other Ambulatory Visit (HOSPITAL_COMMUNITY): Payer: Self-pay | Admitting: Orthopaedic Surgery

## 2013-12-01 DIAGNOSIS — M545 Low back pain, unspecified: Secondary | ICD-10-CM | POA: Diagnosis not present

## 2014-12-09 ENCOUNTER — Emergency Department
Admission: EM | Admit: 2014-12-09 | Discharge: 2014-12-09 | Disposition: A | Discharge status: Home / Self Care | Payer: Medicare Other | Attending: Emergency Medicine | Admitting: Emergency Medicine

## 2014-12-09 ENCOUNTER — Emergency Department: Payer: Medicare Other

## 2014-12-09 DIAGNOSIS — Z79899 Other long term (current) drug therapy: Secondary | ICD-10-CM | POA: Diagnosis not present

## 2014-12-09 DIAGNOSIS — S199XXA Unspecified injury of neck, initial encounter: Secondary | ICD-10-CM | POA: Diagnosis not present

## 2014-12-09 DIAGNOSIS — Z792 Long term (current) use of antibiotics: Secondary | ICD-10-CM | POA: Diagnosis not present

## 2014-12-09 DIAGNOSIS — M545 Low back pain: Secondary | ICD-10-CM

## 2014-12-09 DIAGNOSIS — Y9389 Activity, other specified: Secondary | ICD-10-CM | POA: Diagnosis not present

## 2014-12-09 DIAGNOSIS — I10 Essential (primary) hypertension: Secondary | ICD-10-CM | POA: Insufficient documentation

## 2014-12-09 DIAGNOSIS — Y9289 Other specified places as the place of occurrence of the external cause: Secondary | ICD-10-CM | POA: Insufficient documentation

## 2014-12-09 DIAGNOSIS — Z7901 Long term (current) use of anticoagulants: Secondary | ICD-10-CM | POA: Diagnosis not present

## 2014-12-09 DIAGNOSIS — Y998 Other external cause status: Secondary | ICD-10-CM | POA: Diagnosis not present

## 2014-12-09 DIAGNOSIS — M7918 Myalgia, other site: Secondary | ICD-10-CM

## 2014-12-09 DIAGNOSIS — M542 Cervicalgia: Secondary | ICD-10-CM

## 2014-12-09 DIAGNOSIS — S3992XA Unspecified injury of lower back, initial encounter: Secondary | ICD-10-CM | POA: Insufficient documentation

## 2014-12-09 LAB — CBC
HCT: 34.9 % — ABNORMAL LOW (ref 40.0–52.0)
Hemoglobin: 11.3 g/dL — ABNORMAL LOW (ref 13.0–18.0)
MCH: 25.4 pg — AB (ref 26.0–34.0)
MCHC: 32.3 g/dL (ref 32.0–36.0)
MCV: 78.6 fL — AB (ref 80.0–100.0)
PLATELETS: 317 10*3/uL (ref 150–440)
RBC: 4.44 MIL/uL (ref 4.40–5.90)
RDW: 16.8 % — ABNORMAL HIGH (ref 11.5–14.5)
WBC: 8.8 10*3/uL (ref 3.8–10.6)

## 2014-12-09 MED ORDER — TRAMADOL HCL 50 MG PO TABS: 50.0000 mg | ORAL_TABLET | Freq: Four times a day (QID) | ORAL | Status: DC | PRN

## 2014-12-09 MED ORDER — TRAMADOL HCL 50 MG PO TABS
50.0000 mg | ORAL_TABLET | Freq: Once | ORAL | Status: AC
  Administered 2014-12-09: 50 mg via ORAL
  Filled 2014-12-09: qty 1

## 2014-12-09 NOTE — ED Notes (Signed)
Pt transported to and from xray via stretcher without incident.

## 2014-12-09 NOTE — ED Notes (Signed)
MD at bedside for eval.

## 2014-12-09 NOTE — Discharge Instructions (Signed)
Back Pain, Adult Low back pain is very common. About 1 in 5 people have back pain.The cause of low back pain is rarely dangerous. The pain often gets better over time.About half of people with a sudden onset of back pain feel better in just 2 weeks. About 8 in 10 people feel better by 6 weeks.  CAUSES Some common causes of back pain include:  Strain of the muscles or ligaments supporting the spine.  Wear and tear (degeneration) of the spinal discs.  Arthritis.  Direct injury to the back. DIAGNOSIS Most of the time, the direct cause of low back pain is not known.However, back pain can be treated effectively even when the exact cause of the pain is unknown.Answering your caregiver's questions about your overall health and symptoms is one of the most accurate ways to make sure the cause of your pain is not dangerous. If your caregiver needs more information, he or she may order lab work or imaging tests (X-rays or MRIs).However, even if imaging tests show changes in your back, this usually does not require surgery. HOME CARE INSTRUCTIONS For many people, back pain returns.Since low back pain is rarely dangerous, it is often a condition that people can learn to [facility] their own.   Remain active. It is stressful on the back to sit or stand in one place. Do not sit, drive, or stand in one place for more than 30 minutes at a time. Take short walks on level surfaces as soon as pain allows.Try to increase the length of time you walk each day.  Do not stay in bed.Resting more than 1 or 2 days can delay your recovery.  Do not avoid exercise or work.Your body is made to move.It is not dangerous to be active, even though your back may hurt.Your back will likely heal faster if you return to being active before your pain is gone.  Pay attention to your body when you bend and lift. Many people have less discomfortwhen lifting if they bend their knees, keep the load close to their bodies,and  avoid twisting. Often, the most comfortable positions are those that put less stress on your recovering back.  Find a comfortable position to sleep. Use a firm mattress and lie on your side with your knees slightly bent. If you lie on your back, put a pillow under your knees.  Only take over-the-counter or prescription medicines as directed by your caregiver. Over-the-counter medicines to reduce pain and inflammation are often the most helpful.Your caregiver may prescribe muscle relaxant drugs.These medicines help dull your pain so you can more quickly return to your normal activities and healthy exercise.  Put ice on the injured area.  Put ice in a plastic bag.  Place a towel between your skin and the bag.  Leave the ice on for 15-20 minutes, 03-04 times a day for the first 2 to 3 days. After that, ice and heat may be alternated to reduce pain and spasms.  Ask your caregiver about trying back exercises and gentle massage. This may be of some benefit.  Avoid feeling anxious or stressed.Stress increases muscle tension and can worsen back pain.It is important to recognize when you are anxious or stressed and learn ways to manage it.Exercise is a great option. SEEK MEDICAL CARE IF:  You have pain that is not relieved with rest or medicine.  You have pain that does not improve in 1 week.  You have new symptoms.  You are generally not feeling well. SEEK  IMMEDIATE MEDICAL CARE IF:   You have pain that radiates from your back into your legs.  You develop new bowel or bladder control problems.  You have unusual weakness or numbness in your arms or legs.  You develop nausea or vomiting.  You develop abdominal pain.  You feel faint. Document Released: 03/23/2005 Document Revised: 09/22/2011 Document Reviewed: 07/25/2013 [facility] Patient Information 2015 [place], [place]. This information is not intended to replace advice given to you by your health care provider. Make sure you  discuss any questions you have with your health care provider.  Cervical Sprain A cervical sprain is an injury in the neck in which the strong, fibrous tissues (ligaments) that connect your neck bones stretch or tear. Cervical sprains can range from mild to severe. Severe cervical sprains can cause the neck vertebrae to be unstable. This can lead to damage of the spinal cord and can result in serious nervous system problems. The amount of time it takes for a cervical sprain to get better depends on the cause and extent of the injury. Most cervical sprains heal in 1 to 3 weeks. CAUSES  Severe cervical sprains may be caused by:   Contact sport injuries (such as from football, rugby, wrestling, hockey, auto racing, gymnastics, diving, martial arts, or boxing).   Motor vehicle collisions.   Whiplash injuries. This is an injury from a sudden forward and backward whipping movement of the head and neck.  Falls.  Mild cervical sprains may be caused by:   Being in an awkward position, such as while cradling a telephone between your ear and shoulder.   Sitting in a chair that does not offer proper support.   Working at a poorly [employer] station.   Looking up or down for long periods of time.  SYMPTOMS   Pain, soreness, stiffness, or a burning sensation in the front, back, or sides of the neck. This discomfort may develop immediately after the injury or slowly, 24 hours or more after the injury.   Pain or tenderness directly in the middle of the back of the neck.   Shoulder or upper back pain.   Limited ability to move the neck.   Headache.   Dizziness.   Weakness, numbness, or tingling in the hands or arms.   Muscle spasms.   Difficulty swallowing or chewing.   Tenderness and swelling of the neck.  DIAGNOSIS  Most of the time your health care provider can diagnose a cervical sprain by taking your history and doing a physical exam. Your health care  provider will ask about previous neck injuries and any known neck problems, such as arthritis in the neck. X-rays may be taken to find out if there are any other problems, such as with the bones of the neck. Other tests, such as a CT scan or MRI, may also be needed.  TREATMENT  Treatment depends on the severity of the cervical sprain. Mild sprains can be treated with rest, keeping the neck in place (immobilization), and pain medicines. Severe cervical sprains are immediately immobilized. Further treatment is done to help with pain, muscle spasms, and other symptoms and may include:  Medicines, such as pain relievers, numbing medicines, or muscle relaxants.   Physical therapy. This may involve stretching exercises, strengthening exercises, and posture training. Exercises and improved posture can help stabilize the neck, strengthen muscles, and help stop symptoms from returning.  HOME CARE INSTRUCTIONS   Put ice on the injured area.   Put ice in a  plastic bag.   Place a towel between your skin and the bag.   Leave the ice on for 15-20 minutes, 3-4 times a day.   If your injury was severe, you may have been given a cervical collar to wear. A cervical collar is a two-piece collar designed to keep your neck from moving while it heals.  Do not remove the collar unless instructed by your health care provider.  If you have long hair, keep it outside of the collar.  Ask your health care provider before making any adjustments to your collar. Minor adjustments may be required over time to improve comfort and reduce pressure on your chin or on the back of your head.  Ifyou are allowed to remove the collar for cleaning or bathing, follow your health care provider's instructions on how to do so safely.  Keep your collar clean by wiping it with mild soap and water and drying it completely. If the collar you have been given includes removable pads, remove them every 1-2 days and hand wash them with  soap and water. Allow them to air dry. They should be completely dry before you wear them in the collar.  If you are allowed to remove the collar for cleaning and bathing, wash and dry the skin of your neck. Check your skin for irritation or sores. If you see any, tell your health care provider.  Do not drive while wearing the collar.   Only take over-the-counter or prescription medicines for pain, discomfort, or fever as directed by your health care provider.   Keep all follow-up appointments as directed by your health care provider.   Keep all physical therapy appointments as directed by your health care provider.   Make any needed adjustments to your workstation to promote good posture.   Avoid positions and activities that make your symptoms worse.   Warm up and stretch before being active to help prevent problems.  SEEK MEDICAL CARE IF:   Your pain is not controlled with medicine.   You are unable to decrease your pain medicine over time as planned.   Your activity level is not improving as expected.  SEEK IMMEDIATE MEDICAL CARE IF:   You develop any bleeding.  You develop stomach upset.  You have signs of an allergic reaction to your medicine.   Your symptoms get worse.   You develop new, unexplained symptoms.   You have numbness, tingling, weakness, or paralysis in any part of your body.  MAKE SURE YOU:   Understand these instructions.  Will watch your condition.  Will get help right away if you are not doing well or get worse. Document Released: 01/18/2007 Document Revised: 03/28/2013 Document Reviewed: 09/28/2012 [facility] Patient Information 2015 [place], [place]. This information is not intended to replace advice given to you by your health care provider. Make sure you discuss any questions you have with your health care provider.  Motor Vehicle Collision It is common to have multiple bruises and sore muscles after a motor vehicle collision  (MVC). These tend to feel worse for the first 24 hours. You may have the most stiffness and soreness over the first several hours. You may also feel worse when you wake up the first morning after your collision. After this point, you will usually begin to improve with each day. The speed of improvement often depends on the severity of the collision, the number of injuries, and the location and nature of these injuries. HOME CARE INSTRUCTIONS  Put ice  on the injured area.  Put ice in a plastic bag.  Place a towel between your skin and the bag.  Leave the ice on for 15-20 minutes, 3-4 times a day, or as directed by your health care provider.  Drink enough fluids to keep your urine clear or pale yellow. Do not drink alcohol.  Take a warm shower or bath once or twice a day. This will increase blood flow to sore muscles.  You may return to activities as directed by your caregiver. Be careful when lifting, as this may aggravate neck or back pain.  Only take over-the-counter or prescription medicines for pain, discomfort, or fever as directed by your caregiver. Do not use aspirin. This may increase bruising and bleeding. SEEK IMMEDIATE MEDICAL CARE IF:  You have numbness, tingling, or weakness in the arms or legs.  You develop severe headaches not relieved with medicine.  You have severe neck pain, especially tenderness in the middle of the back of your neck.  You have changes in bowel or bladder control.  There is increasing pain in any area of the body.  You have shortness of breath, light-headedness, dizziness, or fainting.  You have chest pain.  You feel sick to your stomach (nauseous), throw up (vomit), or sweat.  You have increasing abdominal discomfort.  There is blood in your urine, stool, or vomit.  You have pain in your shoulder (shoulder strap areas).  You feel your symptoms are getting worse. MAKE SURE YOU:  Understand these instructions.  Will watch your  condition.  Will get help right away if you are not doing well or get worse. Document Released: 03/23/2005 Document Revised: 08/07/2013 Document Reviewed: 08/20/2010 [facility] Patient Information 2015 [place], [place]. This information is not intended to replace advice given to you by your health care provider. Make sure you discuss any questions you have with your health care provider.  Musculoskeletal Pain Musculoskeletal pain is muscle and boney aches and pains. These pains can occur in any part of the body. Your caregiver may treat you without knowing the cause of the pain. They may treat you if blood or urine tests, X-rays, and other tests were normal.  CAUSES There is often not a definite cause or reason for these pains. These pains may be caused by a type of germ (virus). The discomfort may also come from overuse. Overuse includes working out too hard when your body is not fit. Boney aches also come from weather changes. Bone is sensitive to atmospheric pressure changes. HOME CARE INSTRUCTIONS   Ask when your test results will be ready. Make sure you get your test results.  Only take over-the-counter or prescription medicines for pain, discomfort, or fever as directed by your caregiver. If you were given medications for your condition, do not drive, operate machinery or power tools, or sign legal documents for 24 hours. Do not drink alcohol. Do not take sleeping pills or other medications that may interfere with treatment.  Continue all activities unless the activities cause more pain. When the pain lessens, slowly resume normal activities. Gradually increase the intensity and duration of the activities or exercise.  During periods of severe pain, bed rest may be helpful. Lay or sit in any position that is comfortable.  Putting ice on the injured area.  Put ice in a bag.  Place a towel between your skin and the bag.  Leave the ice on for 15 to 20 minutes, 3 to 4 times a day.  Follow  up  with your caregiver for continued problems and no reason can be found for the pain. If the pain becomes worse or does not go away, it may be necessary to repeat tests or do additional testing. Your caregiver may need to look further for a possible cause. SEEK IMMEDIATE MEDICAL CARE IF:  You have pain that is getting worse and is not relieved by medications.  You develop chest pain that is associated with shortness or breath, sweating, feeling sick to your stomach (nauseous), or throw up (vomit).  Your pain becomes localized to the abdomen.  You develop any new symptoms that seem different or that concern you. MAKE SURE YOU:   Understand these instructions.  Will watch your condition.  Will get help right away if you are not doing well or get worse. Document Released: 03/23/2005 Document Revised: 06/15/2011 Document Reviewed: 11/25/2012 [facility] Patient Information 2015 [place], [place]. This information is not intended to replace advice given to you by your health care provider. Make sure you discuss any questions you have with your health care provider.
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
## 2014-12-09 NOTE — ED Notes (Signed)
MD at bedside for reeval
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
## 2014-12-14 ENCOUNTER — Encounter (HOSPITAL_COMMUNITY): Payer: Self-pay | Admitting: Emergency Medicine

## 2014-12-14 ENCOUNTER — Emergency Department (HOSPITAL_COMMUNITY)
Admission: EM | Admit: 2014-12-14 | Discharge: 2014-12-14 | Disposition: A | Discharge status: Home / Self Care | Payer: Medicare Other | Attending: Emergency Medicine | Admitting: Emergency Medicine

## 2014-12-14 ENCOUNTER — Emergency Department (HOSPITAL_COMMUNITY): Payer: Medicare Other

## 2014-12-14 DIAGNOSIS — E669 Obesity, unspecified: Secondary | ICD-10-CM | POA: Insufficient documentation

## 2014-12-14 DIAGNOSIS — Y9389 Activity, other specified: Secondary | ICD-10-CM | POA: Diagnosis not present

## 2014-12-14 DIAGNOSIS — Z79899 Other long term (current) drug therapy: Secondary | ICD-10-CM | POA: Diagnosis not present

## 2014-12-14 DIAGNOSIS — S3992XA Unspecified injury of lower back, initial encounter: Secondary | ICD-10-CM | POA: Diagnosis not present

## 2014-12-14 DIAGNOSIS — I10 Essential (primary) hypertension: Secondary | ICD-10-CM | POA: Insufficient documentation

## 2014-12-14 DIAGNOSIS — S161XXA Strain of muscle, fascia and tendon at neck level, initial encounter: Secondary | ICD-10-CM | POA: Diagnosis not present

## 2014-12-14 DIAGNOSIS — Y998 Other external cause status: Secondary | ICD-10-CM | POA: Diagnosis not present

## 2014-12-14 DIAGNOSIS — Y92410 Unspecified street and highway as the place of occurrence of the external cause: Secondary | ICD-10-CM | POA: Diagnosis not present

## 2014-12-14 DIAGNOSIS — S199XXA Unspecified injury of neck, initial encounter: Secondary | ICD-10-CM | POA: Diagnosis present

## 2014-12-14 MED ORDER — DICLOFENAC SODIUM 75 MG PO TBEC: 75.0000 mg | DELAYED_RELEASE_TABLET | Freq: Two times a day (BID) | ORAL | Status: DC

## 2014-12-14 MED ORDER — CYCLOBENZAPRINE HCL 10 MG PO TABS: 10.0000 mg | ORAL_TABLET | Freq: Two times a day (BID) | ORAL | Status: DC | PRN

## 2014-12-14 MED ORDER — KETOROLAC TROMETHAMINE 60 MG/2ML IM SOLN
60.0000 mg | Freq: Once | INTRAMUSCULAR | Status: AC
  Administered 2014-12-14: 60 mg via INTRAMUSCULAR
  Filled 2014-12-14: qty 2
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
## 2014-12-14 NOTE — Discharge Instructions (Signed)
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
## 2015-05-28 ENCOUNTER — Telehealth: Payer: Self-pay | Admitting: *Deleted

## 2015-05-28 MED ORDER — OXYCODONE-ACETAMINOPHEN 7.5-325 MG PO TABS: 1.0000 | ORAL_TABLET | ORAL | Status: DC | PRN

## 2015-05-28 NOTE — Telephone Encounter (Signed)
Patient called requesting percet 7.5 qty 130. Please advise [phone]

## 2015-05-28 NOTE — Telephone Encounter (Signed)
Patient aware prescription is available for pick up

## 2015-05-28 NOTE — Telephone Encounter (Signed)
Rx printed

## 2015-06-26 ENCOUNTER — Ambulatory Visit (INDEPENDENT_AMBULATORY_CARE_PROVIDER_SITE_OTHER): Payer: Medicare Other | Admitting: Orthopaedic Surgery

## 2015-06-26 DIAGNOSIS — M545 Low back pain, unspecified: Secondary | ICD-10-CM

## 2015-06-26 MED ORDER — OXYCODONE-ACETAMINOPHEN 7.5-325 MG PO TABS: 1.0000 | ORAL_TABLET | ORAL | Status: DC | PRN
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
## 2015-07-25 ENCOUNTER — Telehealth: Payer: Self-pay | Admitting: Orthopaedic Surgery

## 2015-07-25 MED ORDER — OXYCODONE-ACETAMINOPHEN 7.5-325 MG PO TABS: 1.0000 | ORAL_TABLET | ORAL | Status: DC | PRN

## 2015-07-25 NOTE — Telephone Encounter (Signed)
Rx done. 

## 2015-07-25 NOTE — Telephone Encounter (Signed)
Patient called to request refill on medication: (last filled quantity: 130)  oxyCODONE-acetaminophen (PERCOCET) 7.5-325 MG tablet [[id number]][[id number]]

## 2015-08-22 ENCOUNTER — Telehealth: Payer: Self-pay | Admitting: Orthopaedic Surgery

## 2015-08-22 MED ORDER — OXYCODONE-ACETAMINOPHEN 7.5-325 MG PO TABS: 1.0000 | ORAL_TABLET | ORAL | Status: DC | PRN

## 2015-08-22 NOTE — Telephone Encounter (Signed)
Patient called and requested a refill on Oxycodone-Acetaminophen 7.5-325 mgs.  Qty  130    Sig: One tablet every four hours as needed for pain. Do not drive car or operate machinery. Must last 30 days.

## 2015-08-22 NOTE — Telephone Encounter (Signed)
Rx done. 

## 2015-09-24 ENCOUNTER — Encounter: Payer: Self-pay | Admitting: Orthopaedic Surgery

## 2015-09-24 ENCOUNTER — Ambulatory Visit (INDEPENDENT_AMBULATORY_CARE_PROVIDER_SITE_OTHER): Payer: Medicare Other | Admitting: Orthopaedic Surgery

## 2015-09-24 VITALS — BP 144/98 | HR 65 | Temp 97.5°F | Resp 20

## 2015-09-24 DIAGNOSIS — M545 Low back pain, unspecified: Secondary | ICD-10-CM

## 2015-09-24 MED ORDER — OXYCODONE-ACETAMINOPHEN 7.5-325 MG PO TABS: 1.0000 | ORAL_TABLET | ORAL | Status: DC | PRN

## 2015-09-24 NOTE — Progress Notes (Signed)
Patient ZO:XWRUEA PER BEAGLEY, male DOB:1981/03/19, 35 y.o. VWU:[id number]  Chief Complaint  Patient presents with  . Follow-up    pain left knee and back    HPI  Nathaniel Velasquez is a 35 y.o. male who has chronic lower back pain and chronic left knee pain. He is stable.  He has no new trauma, no paresthesias.  The left knee has no locking or giving way.  He is active.  HPI  There is no weight on file to calculate BMI.  He is massively obese, over 500 pounds.  Unable to weigh here.  ROS  Review of Systems  HENT: Negative for congestion.   Respiratory: Negative for shortness of breath.   Cardiovascular: Negative for chest pain.  Endocrine: Positive for cold intolerance.  Musculoskeletal: Positive for myalgias, back pain, arthralgias and gait problem.  Allergic/Immunologic: Positive for environmental allergies.    Past Medical History  Diagnosis Date  . Hypertension     Past Surgical History  Procedure Laterality Date  . Tonsillectomy      Family History  Problem Relation Age of Onset  . Diabetes Mother   . Heart failure Mother     Social History Social History  Substance Use Topics  . Smoking status: Never Smoker   . Smokeless tobacco: Never Used  . Alcohol Use: No    No Known Allergies  Current Outpatient Prescriptions  Medication Sig Dispense Refill  . carvedilol (COREG) 12.5 MG tablet Take 6.25 mg by mouth 2 (two) times daily with a meal.    . chlorthalidone (HYGROTON) 25 MG tablet Take 25 mg by mouth at bedtime.    . cyclobenzaprine (FLEXERIL) 10 MG tablet Take 1 tablet (10 mg total) by mouth 2 (two) times daily as needed for muscle spasms. 20 tablet 0  . diclofenac (VOLTAREN) 75 MG EC tablet Take 1 tablet (75 mg total) by mouth 2 (two) times daily. 14 tablet 0  . lisinopril (PRINIVIL,ZESTRIL) 10 MG tablet Take 2 tablets by mouth daily.  11  . metFORMIN (GLUCOPHAGE) 500 MG tablet Take 2 tablets by mouth 2 (two) times daily.  1  . oxyCODONE-acetaminophen  (PERCOCET) 7.5-325 MG tablet Take 1 tablet by mouth every 4 (four) hours as needed for moderate pain or severe pain (Must last 30 days.Do not take and drive a car or operate machinery). 130 tablet 0  . traMADol (ULTRAM) 50 MG tablet Take 1 tablet (50 mg total) by mouth every 6 (six) hours as needed. 12 tablet 0   No current facility-administered medications for this visit.     Physical Exam  Blood pressure 144/98, pulse 65, temperature 97.5 F (36.4 C), resp. rate 20.  Constitutional: overall normal hygiene, normal nutrition, well developed, normal grooming, normal body habitus. Assistive device:none  Musculoskeletal: gait and station Limp left, muscle tone and strength are normal, no tremors or atrophy is present.  .  Neurological: coordination overall normal.  Deep tendon reflex/nerve stretch intact.  Sensation normal.  Cranial nerves II-XII intact.   Skin:   normal overall no scars, lesions, ulcers or rashes. No psoriasis.  Psychiatric: Alert and oriented x 3.  Recent memory intact, remote memory unclear.  Normal mood and affect. Well groomed.  Good eye contact.  Cardiovascular: overall no swelling, no varicosities, no edema bilaterally, normal temperatures of the legs and arms, no clubbing, cyanosis and good capillary refill.  Lymphatic: palpation is normal.  Spine/Pelvis examination:  Inspection:  Overall, sacoiliac joint benign and hips nontender; without crepitus or defects.  Thoracic spine inspection: Alignment normal without kyphosis present   Lumbar spine inspection:  Alignment  with normal lumbar lordosis, without scoliosis apparent.   Thoracic spine palpation:  without tenderness of spinal processes   Lumbar spine palpation: with tenderness of lumbar area; without tightness of lumbar muscles    Range of Motion:   Lumbar flexion, forward flexion is 20  with pain or tenderness    Lumbar extension is 5  with pain or tenderness   Left lateral bend is Normal   without pain or tenderness   Right lateral bend is Normal without pain or tenderness   Straight leg raising is Normal   Strength & tone: Normal   Stability overall normal stability     The patient has been educated about the nature of the problem(s) and counseled on treatment options.  The patient appeared to understand what I have discussed and is in agreement with it.  Encounter Diagnoses  Name Primary?  . Midline low back pain without sciatica Yes  . Morbid obesity due to excess calories [facility](HCC)     PLAN Call if any problems.  Precautions discussed.  Continue current medications.   Return to clinic 3 months   Electronically Signed Darreld McleanWayne Marne Meline, MD 6/20/201710:49 AM

## 2015-10-23 ENCOUNTER — Telehealth: Payer: Self-pay | Admitting: Orthopaedic Surgery

## 2015-10-23 MED ORDER — OXYCODONE-ACETAMINOPHEN 7.5-325 MG PO TABS: 1.0000 | ORAL_TABLET | ORAL | Status: DC | PRN

## 2015-10-23 NOTE — Telephone Encounter (Signed)
Patient called to request refill ZO:[id number]:oxyCODONE-acetaminophen (PERCOCET) 7.5-325 MG tablet [[id number]][[id number]] - quantity 120.

## 2015-10-23 NOTE — Telephone Encounter (Signed)
Rx Done . 

## 2015-11-26 ENCOUNTER — Telehealth: Payer: Self-pay | Admitting: Orthopaedic Surgery

## 2015-11-26 MED ORDER — OXYCODONE-ACETAMINOPHEN 7.5-325 MG PO TABS: 1.0000 | ORAL_TABLET | ORAL | 0 refills | Status: DC | PRN

## 2015-11-26 NOTE — Telephone Encounter (Signed)
Patient called to request refill on:  oxyCODONE-acetaminophen (PERCOCET) 7.5-325 MG tablet 130 tablet 0

## 2015-12-10 ENCOUNTER — Ambulatory Visit (INDEPENDENT_AMBULATORY_CARE_PROVIDER_SITE_OTHER): Payer: Medicare Other | Admitting: Orthopaedic Surgery

## 2015-12-10 ENCOUNTER — Encounter: Payer: Self-pay | Admitting: Orthopaedic Surgery

## 2015-12-10 VITALS — BP 156/94 | HR 87 | Temp 97.0°F | Ht 72.0 in

## 2015-12-10 DIAGNOSIS — M545 Low back pain, unspecified: Secondary | ICD-10-CM

## 2015-12-10 NOTE — Progress Notes (Signed)
Patient Nathaniel Velasquez, male DOB:12/20/1980, 35 y.o. VWU:[id number]  Chief Complaint  Patient presents with  . Follow-up    Back and knee pain    HPI  Tyrell AntonioDonald J Gassen is a 35 y.o. male who has chronic lower back and bilateral knee pain.  He has no new trauma.  He has no paresthesias.  He is active.  He is extremely obese with weight over 500++ pounds. HPI  There is no height or weight on file to calculate BMI.  ROS  Review of Systems  HENT: Negative for congestion.   Respiratory: Negative for shortness of breath.   Cardiovascular: Negative for chest pain.  Endocrine: Positive for cold intolerance.  Musculoskeletal: Positive for arthralgias, back pain, gait problem and myalgias.  Allergic/Immunologic: Positive for environmental allergies.    Past Medical History:  Diagnosis Date  . Hypertension     Past Surgical History:  Procedure Laterality Date  . TONSILLECTOMY      Family History  Problem Relation Age of Onset  . Diabetes Mother   . Heart failure Mother     Social History Social History  Substance Use Topics  . Smoking status: Never Smoker  . Smokeless tobacco: Never Used  . Alcohol use No    No Known Allergies  Current Outpatient Prescriptions  Medication Sig Dispense Refill  . carvedilol (COREG) 12.5 MG tablet Take 6.25 mg by mouth 2 (two) times daily with a meal.    . chlorthalidone (HYGROTON) 25 MG tablet Take 25 mg by mouth at bedtime.    . cyclobenzaprine (FLEXERIL) 10 MG tablet Take 1 tablet (10 mg total) by mouth 2 (two) times daily as needed for muscle spasms. 20 tablet 0  . diclofenac (VOLTAREN) 75 MG EC tablet Take 1 tablet (75 mg total) by mouth 2 (two) times daily. 14 tablet 0  . lisinopril (PRINIVIL,ZESTRIL) 10 MG tablet Take 2 tablets by mouth daily.  11  . metFORMIN (GLUCOPHAGE) 500 MG tablet Take 2 tablets by mouth 2 (two) times daily.  1  . oxyCODONE-acetaminophen (PERCOCET) 7.5-325 MG tablet Take 1 tablet by mouth every 4 (four) hours  as needed for moderate pain or severe pain (Must last 30 days.Do not take and drive a car or operate machinery). 130 tablet 0  . traMADol (ULTRAM) 50 MG tablet Take 1 tablet (50 mg total) by mouth every 6 (six) hours as needed. 12 tablet 0   No current facility-administered medications for this visit.      Physical Exam  Blood pressure (!) 156/94, pulse 87, temperature 97 F (36.1 C), height 6' (1.829 m).  Constitutional: overall normal hygiene, normal nutrition, well developed, normal grooming, normal body habitus. Assistive device:none  Musculoskeletal: gait and station Limp waddling gait, muscle tone and strength are normal, no tremors or atrophy is present.  .  Neurological: coordination overall normal.  Deep tendon reflex/nerve stretch intact.  Sensation normal.  Cranial nerves II-XII intact.   Skin:   normal overall no scars, lesions, ulcers or rashes. No psoriasis.  Psychiatric: Alert and oriented x 3.  Recent memory intact, remote memory unclear.  Normal mood and affect. Well groomed.  Good eye contact.  Cardiovascular: overall no swelling, no varicosities, no edema bilaterally, normal temperatures of the legs and arms, no clubbing, cyanosis and good capillary refill.  Lymphatic: palpation is normal.  Spine/Pelvis examination:  Inspection:  Overall, sacoiliac joint benign and hips nontender; without crepitus or defects.   Thoracic spine inspection: Alignment normal without kyphosis present  Lumbar spine inspection:  Alignment  with normal lumbar lordosis, without scoliosis apparent.   Thoracic spine palpation:  without tenderness of spinal processes   Lumbar spine palpation: with tenderness of lumbar area; without tightness of lumbar muscles    Range of Motion:   Lumbar flexion, forward flexion is 15 without pain or tenderness    Lumbar extension is 5 without pain or tenderness   Left lateral bend is Normal  without pain or tenderness   Right lateral bend is  Normal without pain or tenderness   Straight leg raising is Normal   Strength & tone: Normal   Stability overall normal stability     The patient has been educated about the nature of the problem(s) and counseled on treatment options.  The patient appeared to understand what I have discussed and is in agreement with it.  Encounter Diagnoses  Name Primary?  . Midline low back pain without sciatica Yes  . Morbid obesity due to excess calories [facility])     PLAN Call if any problems.  Precautions discussed.  Continue current medications.   Return to clinic 3 months   Electronically Signed Darreld Mclean, MD 9/5/20178:41 AM

## 2015-12-25 ENCOUNTER — Telehealth: Payer: Self-pay | Admitting: Orthopaedic Surgery

## 2015-12-25 MED ORDER — OXYCODONE-ACETAMINOPHEN 7.5-325 MG PO TABS: 1.0000 | ORAL_TABLET | Freq: Four times a day (QID) | ORAL | 0 refills | Status: DC | PRN

## 2015-12-25 NOTE — Telephone Encounter (Signed)
Patient requests refill on Oxycodone/Acetaminophen(Percocet) 7.5-325 mgs.   Qty  130  Sig: Take 1 tablet by mouth every 4 (four) hours as needed for moderate pain or severe pain (Must last 30 days.Do not take and drive a car or operate machinery).

## 2016-01-21 ENCOUNTER — Telehealth: Payer: Self-pay | Admitting: Orthopaedic Surgery

## 2016-01-21 NOTE — Telephone Encounter (Signed)
Patient requests refill:  oxyCODONE-acetaminophen (PERCOCET) 7.5-325 MG tablet 130 tablet   - insurance:  [employer] primary                       Medicaid, secondary

## 2016-01-22 MED ORDER — OXYCODONE-ACETAMINOPHEN 7.5-325 MG PO TABS: 1.0000 | ORAL_TABLET | Freq: Four times a day (QID) | ORAL | 0 refills | Status: DC | PRN

## 2016-02-05 DIAGNOSIS — Z789 Other specified health status: Secondary | ICD-10-CM | POA: Insufficient documentation

## 2016-02-19 ENCOUNTER — Other Ambulatory Visit: Payer: Self-pay | Admitting: *Deleted

## 2016-02-19 ENCOUNTER — Telehealth: Payer: Self-pay | Admitting: Orthopaedic Surgery

## 2016-02-19 MED ORDER — OXYCODONE-ACETAMINOPHEN 7.5-325 MG PO TABS
1.0000 | ORAL_TABLET | Freq: Four times a day (QID) | ORAL | 0 refills | Status: DC | PRN
Start: 2016-02-19 — End: 2016-03-10

## 2016-02-19 NOTE — Telephone Encounter (Signed)
Patient called for refill of pain medication; aware that Dr Romeo AppleHarrison is reviewing requests as Dr Hilda LiasKeeling out of office this week:  oxyCODONE-acetaminophen (PERCOCET) 7.5-325 MG tablet, quantity 130 tablets

## 2016-03-10 ENCOUNTER — Ambulatory Visit (INDEPENDENT_AMBULATORY_CARE_PROVIDER_SITE_OTHER): Payer: Medicare Other | Admitting: Orthopaedic Surgery

## 2016-03-10 ENCOUNTER — Encounter: Payer: Self-pay | Admitting: Orthopaedic Surgery

## 2016-03-10 VITALS — BP 143/90 | HR 77 | Ht 72.0 in | Wt >= 6400 oz

## 2016-03-10 DIAGNOSIS — G8929 Other chronic pain: Secondary | ICD-10-CM

## 2016-03-10 DIAGNOSIS — M545 Low back pain, unspecified: Secondary | ICD-10-CM

## 2016-03-10 MED ORDER — OXYCODONE-ACETAMINOPHEN 7.5-325 MG PO TABS: 1.0000 | ORAL_TABLET | Freq: Four times a day (QID) | ORAL | 0 refills | Status: DC | PRN

## 2016-03-10 NOTE — Progress Notes (Signed)
Patient Nathaniel Velasquez:Densil Sharyon CableJ Traeger, male DOB:06/09/1980, 35 y.o. VWU:[id number]  Chief Complaint  Patient presents with  . Follow-up    Chronic knee pain    HPI  Nathaniel Velasquez is a 35 y.o. male who has chronic lower back pain.  He has no new trauma.  He has no paresthesias.  He has some pain in both knees with no new injury.  He is active. HPI  Body mass index is 84.09 kg/m.  ROS  Review of Systems  HENT: Negative for congestion.   Respiratory: Negative for shortness of breath.   Cardiovascular: Negative for chest pain.  Endocrine: Positive for cold intolerance.  Musculoskeletal: Positive for arthralgias, back pain, gait problem and myalgias.  Allergic/Immunologic: Positive for environmental allergies.    Past Medical History:  Diagnosis Date  . Hypertension     Past Surgical History:  Procedure Laterality Date  . TONSILLECTOMY      Family History  Problem Relation Age of Onset  . Diabetes Mother   . Heart failure Mother     Social History Social History  Substance Use Topics  . Smoking status: Never Smoker  . Smokeless tobacco: Never Used  . Alcohol use No    No Known Allergies  Current Outpatient Prescriptions  Medication Sig Dispense Refill  . carvedilol (COREG) 12.5 MG tablet Take 6.25 mg by mouth 2 (two) times daily with a meal.    . chlorthalidone (HYGROTON) 25 MG tablet Take 25 mg by mouth at bedtime.    . cyclobenzaprine (FLEXERIL) 10 MG tablet Take 1 tablet (10 mg total) by mouth 2 (two) times daily as needed for muscle spasms. 20 tablet 0  . diclofenac (VOLTAREN) 75 MG EC tablet Take 1 tablet (75 mg total) by mouth 2 (two) times daily. 14 tablet 0  . lisinopril (PRINIVIL,ZESTRIL) 10 MG tablet Take 2 tablets by mouth daily.  11  . metFORMIN (GLUCOPHAGE) 500 MG tablet Take 2 tablets by mouth 2 (two) times daily.  1  . oxyCODONE-acetaminophen (PERCOCET) 7.5-325 MG tablet Take 1 tablet by mouth every 6 (six) hours as needed for moderate pain or severe pain  (Must last 30 days.Do not take and drive a car or operate machinery). 130 tablet 0  . traMADol (ULTRAM) 50 MG tablet Take 1 tablet (50 mg total) by mouth every 6 (six) hours as needed. 12 tablet 0   No current facility-administered medications for this visit.      Physical Exam  Blood pressure (!) 143/90, pulse 77, height 6' (1.829 m), weight (!) 620 lb (281.2 kg).  Constitutional: overall normal hygiene, normal nutrition, well developed, normal grooming, normal body habitus. Assistive device:none  Musculoskeletal: gait and station Limp none, muscle tone and strength are normal, no tremors or atrophy is present.  .  Neurological: coordination overall normal.  Deep tendon reflex/nerve stretch intact.  Sensation normal.  Cranial nerves II-XII intact.   Skin:   Normal overall no scars, lesions, ulcers or rashes. No psoriasis.  Psychiatric: Alert and oriented x 3.  Recent memory intact, remote memory unclear.  Normal mood and affect. Well groomed.  Good eye contact.  Cardiovascular: overall no swelling, no varicosities, no edema bilaterally, normal temperatures of the legs and arms, no clubbing, cyanosis and good capillary refill.  Lymphatic: palpation is normal.  Spine/Pelvis examination:  Inspection:  Overall, sacoiliac joint benign and hips nontender; without crepitus or defects.   Thoracic spine inspection: Alignment normal without kyphosis present   Lumbar spine inspection:  Alignment  with  normal lumbar lordosis, without scoliosis apparent.   Thoracic spine palpation:  without tenderness of spinal processes   Lumbar spine palpation: with tenderness of lumbar area; without tightness of lumbar muscles    Range of Motion:   Lumbar flexion, forward flexion is 20 without pain or tenderness    Lumbar extension is 5 without pain or tenderness   Left lateral bend is Normal  without pain or tenderness   Right lateral bend is Normal without pain or tenderness   Straight leg  raising is Normal   Strength & tone: Normal   Stability overall normal stability     The patient has been educated about the nature of the problem(s) and counseled on treatment options.  The patient appeared to understand what I have discussed and is in agreement with it.  Encounter Diagnoses  Name Primary?  . 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 Morbid obesity due to excess calories [facility](HCC)     PLAN Call if any problems.  Precautions discussed.  Continue current medications.   Return to clinic 3 months   Electronically Signed Darreld McleanWayne Maryln Eastham, MD 12/5/20178:43 AM

## 2016-04-21 ENCOUNTER — Telehealth: Payer: Self-pay | Admitting: Orthopaedic Surgery

## 2016-04-21 MED ORDER — OXYCODONE-ACETAMINOPHEN 7.5-325 MG PO TABS: 1.0000 | ORAL_TABLET | Freq: Four times a day (QID) | ORAL | 0 refills | Status: DC | PRN

## 2016-04-21 NOTE — Telephone Encounter (Signed)
Oxycodone-Acetaminophen  7.5/325 mg    Qty 130 Tablets °

## 2016-05-05 ENCOUNTER — Encounter: Payer: Self-pay | Admitting: Emergency Medicine

## 2016-05-05 ENCOUNTER — Emergency Department
Admission: EM | Admit: 2016-05-05 | Discharge: 2016-05-06 | Disposition: A | Discharge status: Home / Self Care | Payer: No Typology Code available for payment source | Attending: Emergency Medicine | Admitting: Emergency Medicine

## 2016-05-05 DIAGNOSIS — Z7984 Long term (current) use of oral hypoglycemic drugs: Secondary | ICD-10-CM | POA: Insufficient documentation

## 2016-05-05 DIAGNOSIS — M545 Low back pain: Secondary | ICD-10-CM | POA: Diagnosis not present

## 2016-05-05 DIAGNOSIS — R51 Headache: Secondary | ICD-10-CM | POA: Insufficient documentation

## 2016-05-05 DIAGNOSIS — Y999 Unspecified external cause status: Secondary | ICD-10-CM | POA: Diagnosis not present

## 2016-05-05 DIAGNOSIS — I10 Essential (primary) hypertension: Secondary | ICD-10-CM | POA: Insufficient documentation

## 2016-05-05 DIAGNOSIS — M542 Cervicalgia: Secondary | ICD-10-CM | POA: Diagnosis not present

## 2016-05-05 DIAGNOSIS — Y92410 Unspecified street and highway as the place of occurrence of the external cause: Secondary | ICD-10-CM | POA: Diagnosis not present

## 2016-05-05 DIAGNOSIS — Y9389 Activity, other specified: Secondary | ICD-10-CM | POA: Insufficient documentation

## 2016-05-05 DIAGNOSIS — Z79899 Other long term (current) drug therapy: Secondary | ICD-10-CM | POA: Diagnosis not present

## 2016-05-05 DIAGNOSIS — M79605 Pain in left leg: Secondary | ICD-10-CM | POA: Insufficient documentation

## 2016-05-05 DIAGNOSIS — S199XXA Unspecified injury of neck, initial encounter: Secondary | ICD-10-CM | POA: Diagnosis present

## 2016-05-05 DIAGNOSIS — M7918 Myalgia, other site: Secondary | ICD-10-CM

## 2016-05-05 HISTORY — DX: Reserved for inherently not codable concepts without codable children: IMO0001

## 2016-05-05 MED ORDER — IBUPROFEN 800 MG PO TABS
800.0000 mg | ORAL_TABLET | Freq: Once | ORAL | Status: AC
  Administered 2016-05-06: 800 mg via ORAL
  Filled 2016-05-05: qty 1

## 2016-05-05 NOTE — ED Notes (Signed)
ED Provider at bedside. 

## 2016-05-05 NOTE — ED Notes (Signed)
Pt talking on phone while RN was finishing triage no distress noted
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
## 2016-05-06 ENCOUNTER — Emergency Department: Payer: No Typology Code available for payment source

## 2016-05-06 DIAGNOSIS — M542 Cervicalgia: Secondary | ICD-10-CM | POA: Diagnosis not present

## 2016-05-06 MED ORDER — TRAMADOL HCL 50 MG PO TABS: 50.0000 mg | ORAL_TABLET | Freq: Four times a day (QID) | ORAL | 0 refills | Status: DC | PRN
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
## 2016-05-19 ENCOUNTER — Telehealth: Payer: Self-pay | Admitting: Orthopaedic Surgery

## 2016-05-19 MED ORDER — OXYCODONE-ACETAMINOPHEN 7.5-325 MG PO TABS: 1.0000 | ORAL_TABLET | Freq: Four times a day (QID) | ORAL | 0 refills | Status: DC | PRN

## 2016-05-20 ENCOUNTER — Encounter: Payer: Self-pay | Admitting: Orthopaedic Surgery

## 2016-06-09 ENCOUNTER — Ambulatory Visit: Payer: Medicare Other | Admitting: Orthopaedic Surgery

## 2016-06-16 ENCOUNTER — Ambulatory Visit (INDEPENDENT_AMBULATORY_CARE_PROVIDER_SITE_OTHER): Payer: Medicare Other | Admitting: Orthopaedic Surgery

## 2016-06-16 ENCOUNTER — Encounter: Payer: Self-pay | Admitting: Orthopaedic Surgery

## 2016-06-16 VITALS — BP 169/92 | HR 85 | Temp 97.5°F | Ht 72.0 in | Wt >= 6400 oz

## 2016-06-16 DIAGNOSIS — M545 Low back pain, unspecified: Secondary | ICD-10-CM

## 2016-06-16 DIAGNOSIS — G8929 Other chronic pain: Secondary | ICD-10-CM

## 2016-06-16 MED ORDER — OXYCODONE-ACETAMINOPHEN 7.5-325 MG PO TABS: 1.0000 | ORAL_TABLET | Freq: Four times a day (QID) | ORAL | 0 refills | Status: DC | PRN
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
## 2016-07-07 ENCOUNTER — Emergency Department (HOSPITAL_COMMUNITY): Payer: No Typology Code available for payment source

## 2016-07-07 ENCOUNTER — Encounter (HOSPITAL_COMMUNITY): Payer: Self-pay | Admitting: Emergency Medicine

## 2016-07-07 ENCOUNTER — Emergency Department (HOSPITAL_COMMUNITY)
Admission: EM | Admit: 2016-07-07 | Discharge: 2016-07-07 | Disposition: A | Discharge status: Home / Self Care | Payer: No Typology Code available for payment source | Attending: Emergency Medicine | Admitting: Emergency Medicine

## 2016-07-07 DIAGNOSIS — M546 Pain in thoracic spine: Secondary | ICD-10-CM | POA: Diagnosis not present

## 2016-07-07 DIAGNOSIS — Y9389 Activity, other specified: Secondary | ICD-10-CM | POA: Diagnosis not present

## 2016-07-07 DIAGNOSIS — Y92410 Unspecified street and highway as the place of occurrence of the external cause: Secondary | ICD-10-CM | POA: Diagnosis not present

## 2016-07-07 DIAGNOSIS — M542 Cervicalgia: Secondary | ICD-10-CM | POA: Insufficient documentation

## 2016-07-07 DIAGNOSIS — Z79899 Other long term (current) drug therapy: Secondary | ICD-10-CM | POA: Insufficient documentation

## 2016-07-07 DIAGNOSIS — I10 Essential (primary) hypertension: Secondary | ICD-10-CM | POA: Insufficient documentation

## 2016-07-07 DIAGNOSIS — Z7984 Long term (current) use of oral hypoglycemic drugs: Secondary | ICD-10-CM | POA: Insufficient documentation

## 2016-07-07 DIAGNOSIS — Y999 Unspecified external cause status: Secondary | ICD-10-CM | POA: Insufficient documentation

## 2016-07-07 DIAGNOSIS — R1033 Periumbilical pain: Secondary | ICD-10-CM | POA: Diagnosis not present

## 2016-07-07 DIAGNOSIS — S199XXA Unspecified injury of neck, initial encounter: Secondary | ICD-10-CM | POA: Diagnosis present

## 2016-07-07 MED ORDER — TRAMADOL HCL 50 MG PO TABS: 50.0000 mg | ORAL_TABLET | Freq: Four times a day (QID) | ORAL | 0 refills | Status: DC | PRN
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
## 2016-07-07 NOTE — Discharge Instructions (Signed)
Follow-up with your family doctor if needed. Return if symptoms are getting worse.
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
## 2016-07-14 ENCOUNTER — Telehealth: Payer: Self-pay | Admitting: Orthopaedic Surgery

## 2016-07-14 MED ORDER — OXYCODONE-ACETAMINOPHEN 7.5-325 MG PO TABS: 1.0000 | ORAL_TABLET | Freq: Four times a day (QID) | ORAL | 0 refills | Status: DC | PRN

## 2016-07-14 NOTE — Telephone Encounter (Signed)
Oxycodone-Acetaminophen  7.5/325 mg    Qty 130 Tablets °

## 2016-07-15 ENCOUNTER — Emergency Department (HOSPITAL_COMMUNITY): Payer: No Typology Code available for payment source

## 2016-07-15 ENCOUNTER — Emergency Department (HOSPITAL_COMMUNITY)
Admission: EM | Admit: 2016-07-15 | Discharge: 2016-07-15 | Disposition: A | Discharge status: Home / Self Care | Payer: No Typology Code available for payment source | Attending: Emergency Medicine | Admitting: Emergency Medicine

## 2016-07-15 ENCOUNTER — Encounter (HOSPITAL_COMMUNITY): Payer: Self-pay | Admitting: Emergency Medicine

## 2016-07-15 DIAGNOSIS — S199XXA Unspecified injury of neck, initial encounter: Secondary | ICD-10-CM | POA: Diagnosis present

## 2016-07-15 DIAGNOSIS — Z7984 Long term (current) use of oral hypoglycemic drugs: Secondary | ICD-10-CM | POA: Insufficient documentation

## 2016-07-15 DIAGNOSIS — Y939 Activity, unspecified: Secondary | ICD-10-CM | POA: Diagnosis not present

## 2016-07-15 DIAGNOSIS — Y92410 Unspecified street and highway as the place of occurrence of the external cause: Secondary | ICD-10-CM | POA: Diagnosis not present

## 2016-07-15 DIAGNOSIS — S161XXA Strain of muscle, fascia and tendon at neck level, initial encounter: Secondary | ICD-10-CM | POA: Diagnosis not present

## 2016-07-15 DIAGNOSIS — Y999 Unspecified external cause status: Secondary | ICD-10-CM | POA: Insufficient documentation

## 2016-07-15 DIAGNOSIS — Z79899 Other long term (current) drug therapy: Secondary | ICD-10-CM | POA: Insufficient documentation

## 2016-07-15 DIAGNOSIS — I10 Essential (primary) hypertension: Secondary | ICD-10-CM | POA: Diagnosis not present

## 2016-07-15 DIAGNOSIS — M25512 Pain in left shoulder: Secondary | ICD-10-CM

## 2016-07-15 LAB — CBG MONITORING, ED: Glucose-Capillary: 154 mg/dL — ABNORMAL HIGH (ref 65–99)

## 2016-07-15 MED ORDER — HYDROMORPHONE HCL 1 MG/ML IJ SOLN
1.0000 mg | Freq: Once | INTRAMUSCULAR | Status: AC
  Administered 2016-07-15: 1 mg via INTRAMUSCULAR
  Filled 2016-07-15: qty 1

## 2016-07-15 MED ORDER — METHOCARBAMOL 500 MG PO TABS: 1000.0000 mg | ORAL_TABLET | Freq: Four times a day (QID) | ORAL | 0 refills | Status: AC

## 2016-07-15 MED ORDER — METHOCARBAMOL 500 MG PO TABS
1000.0000 mg | ORAL_TABLET | Freq: Once | ORAL | Status: AC
  Administered 2016-07-15: 1000 mg via ORAL
  Filled 2016-07-15: qty 2
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
## 2016-08-12 ENCOUNTER — Telehealth: Payer: Self-pay | Admitting: Orthopaedic Surgery

## 2016-08-12 MED ORDER — OXYCODONE-ACETAMINOPHEN 7.5-325 MG PO TABS: 1.0000 | ORAL_TABLET | Freq: Four times a day (QID) | ORAL | 0 refills | Status: DC | PRN

## 2016-08-12 NOTE — Telephone Encounter (Signed)
Oxycodone-Acetaminophen  7.5/325 mg    Qty 130 Tablets °

## 2016-09-11 ENCOUNTER — Encounter (HOSPITAL_COMMUNITY): Payer: Self-pay | Admitting: Emergency Medicine

## 2016-09-11 ENCOUNTER — Emergency Department (HOSPITAL_COMMUNITY): Payer: Medicare Other

## 2016-09-11 ENCOUNTER — Emergency Department (HOSPITAL_COMMUNITY)
Admission: EM | Admit: 2016-09-11 | Discharge: 2016-09-11 | Disposition: A | Discharge status: Home Health | Payer: Medicare Other | Attending: Emergency Medicine | Admitting: Emergency Medicine

## 2016-09-11 DIAGNOSIS — B9789 Other viral agents as the cause of diseases classified elsewhere: Secondary | ICD-10-CM

## 2016-09-11 DIAGNOSIS — J069 Acute upper respiratory infection, unspecified: Secondary | ICD-10-CM | POA: Insufficient documentation

## 2016-09-11 DIAGNOSIS — Z7984 Long term (current) use of oral hypoglycemic drugs: Secondary | ICD-10-CM | POA: Insufficient documentation

## 2016-09-11 DIAGNOSIS — I10 Essential (primary) hypertension: Secondary | ICD-10-CM | POA: Diagnosis not present

## 2016-09-11 DIAGNOSIS — R05 Cough: Secondary | ICD-10-CM | POA: Diagnosis present

## 2016-09-11 MED ORDER — BENZONATATE 100 MG PO CAPS: 200.0000 mg | ORAL_CAPSULE | Freq: Three times a day (TID) | ORAL | 0 refills | Status: DC | PRN

## 2016-09-11 MED ORDER — AZITHROMYCIN 250 MG PO TABS: ORAL_TABLET | ORAL | 0 refills | Status: DC

## 2016-09-11 MED ORDER — BENZONATATE 100 MG PO CAPS
200.0000 mg | ORAL_CAPSULE | Freq: Once | ORAL | Status: DC
  Filled 2016-09-11: qty 2

## 2016-09-11 MED ORDER — ALBUTEROL SULFATE HFA 108 (90 BASE) MCG/ACT IN AERS
2.0000 | INHALATION_SPRAY | Freq: Once | RESPIRATORY_TRACT | Status: AC
  Administered 2016-09-11: 2 via RESPIRATORY_TRACT
  Filled 2016-09-11: qty 6.7

## 2016-09-11 NOTE — ED Triage Notes (Signed)
Pt is morbidly obese, complains of abd pain, congestion and cough for the last three days  [facility]   Pt has not contacted, but saw last week

## 2016-09-11 NOTE — Discharge Instructions (Signed)
Use 2 puffs of your inhaler every 4 hours if you are coughing or wheezing.  The tessalon will also help with cough.  Rest and make sure you are drinking plenty of fluids.  I recommend taking Coricidin Brand medication to help with your nasal drainage and congestion.  This brand will not interfere with your blood pressure. You have a viral upper respiratory which should improve with time.  Your chest xray is negative for any signs of infection at this time but you are being prescribed an antibiotic as I suspect you may have an early bronchitis.

## 2016-09-11 NOTE — ED Provider Notes (Signed)
[facility]-EMERGENCY DEPT Provider Note   CSN: [id number] Arrival date & time: 09/11/16  1437     History   Chief Complaint Chief Complaint  Patient presents with  . Cough    HPI Nathaniel Velasquez is a 36 y.o. male presenting with a 3 day history of uri type symptoms which includes nasal congestion with clear rhinorrhea, sore throat, and cough which has been productive of clear white to yellow sputum.  Symptoms do not include shortness of breath,nausea, vomiting or diarrhea. He does endorse soreness in his chest and upper abdomen due to muscle soreness from coughing.  The patient has taken no medications prior to arrival with no significant improvement in symptoms. Marland Kitchen.  HPI  Past Medical History:  Diagnosis Date  . Hypertension   . Prediabetic coma [facility])     Patient Active Problem List   Diagnosis Date Noted  . PALPITATIONS 06/20/2009  . OBESITY, UNSPECIFIED 06/19/2009  . HYPERTENSION 06/19/2009  . ACID REFLUX DISEASE 06/19/2009    Past Surgical History:  Procedure Laterality Date  . TONSILLECTOMY         Home Medications    Prior to Admission medications   Medication Sig Start Date End Date Taking? Authorizing Provider  azithromycin (ZITHROMAX Z-PAK) 250 MG tablet Take 2 tablets by mouth on day one followed by one tablet daily for 4 days. 09/11/16   Burgess AmorIdol, Malala Trenkamp, PA-C  benzonatate (TESSALON) 100 MG capsule Take 2 capsules (200 mg total) by mouth 3 (three) times daily as needed. 09/11/16   Burgess AmorIdol, Orlander Norwood, PA-C  carvedilol (COREG) 12.5 MG tablet Take 6.25 mg by mouth 2 (two) times daily with a meal.    [provider]  chlorthalidone (HYGROTON) 25 MG tablet Take 25 mg by mouth at bedtime.    [provider]  cyclobenzaprine (FLEXERIL) 10 MG tablet Take 1 tablet (10 mg total) by mouth 2 (two) times daily as needed for muscle spasms. Patient not taking: Reported on 07/07/2016 12/14/14   Elson AreasSofia, Leslie K, PA-C  diclofenac (VOLTAREN) 75 MG EC tablet Take 1 tablet (75 mg  total) by mouth 2 (two) times daily. Patient not taking: Reported on 07/07/2016 12/14/14   Elson AreasSofia, Leslie K, PA-C  esomeprazole (NEXIUM) 40 MG capsule Take 40 mg by mouth daily. 03/17/16   [provider]  lisinopril (PRINIVIL,ZESTRIL) 10 MG tablet Take 2 tablets by mouth daily. 11/14/14   [provider]  metFORMIN (GLUCOPHAGE) 500 MG tablet Take 2 tablets by mouth 2 (two) times daily. 12/06/14   [provider]  oxyCODONE-acetaminophen (PERCOCET) 7.5-325 MG tablet Take 1 tablet by mouth every 6 (six) hours as needed for moderate pain or severe pain (Must last 30 days.Do not take and drive a car or operate machinery). 08/12/16   Darreld McleanKeeling, Wayne, MD  sitaGLIPtin (JANUVIA) 25 MG tablet Take 25 mg by mouth daily. 03/11/16 03/11/17  [provider]  traMADol (ULTRAM) 50 MG tablet Take 1 tablet (50 mg total) by mouth every 6 (six) hours as needed. 07/07/16   Bethann BerkshireZammit, Joseph, MD    Family History Family History  Problem Relation Age of Onset  . Diabetes Mother   . Heart failure Mother     Social History Social History  Substance Use Topics  . Smoking status: Never Smoker  . Smokeless tobacco: Never Used  . Alcohol use No     Allergies   Patient has no known allergies.   Review of Systems Review of Systems  Constitutional: Negative for chills and fever.  HENT: Positive for congestion, rhinorrhea and sore throat. Negative for ear pain, sinus pressure, trouble swallowing and voice change.   Eyes: Negative for discharge.  Respiratory: Positive for cough. Negative for shortness of breath, wheezing and stridor.   Cardiovascular: Negative for chest pain.  Gastrointestinal: Negative for abdominal pain.  Genitourinary: Negative.      Physical Exam Updated Vital Signs BP 134/80 (BP Location: Left Arm)   Pulse 88   Temp 98.9 F (37.2 C) (Oral)   Resp 20   SpO2 98%   Physical Exam  Constitutional: He is oriented to person, place, and time. He appears  well-developed and well-nourished.  Morbid obesity  HENT:  Head: Normocephalic and atraumatic.  Nose: Mucosal edema and rhinorrhea present.  Mouth/Throat: Uvula is midline, oropharynx is clear and moist and mucous membranes are normal. No oropharyngeal exudate, posterior oropharyngeal edema, posterior oropharyngeal erythema or tonsillar abscesses.  Eyes: Conjunctivae are normal.  Cardiovascular: Normal rate and normal heart sounds.   Pulmonary/Chest: Effort normal. No respiratory distress. He has no wheezes. He has no rales.  Distant breath sounds.  Abdominal: Soft. There is no tenderness.  Musculoskeletal: Normal range of motion.  Neurological: He is alert and oriented to person, place, and time.  Skin: Skin is warm and dry. No rash noted.  Psychiatric: He has a normal mood and affect.     ED Treatments / Results  Labs (all labs ordered are listed, but only abnormal results are displayed) Labs Reviewed - No data to display  EKG  EKG Interpretation None       Radiology Dg Chest 2 View  Result Date: 09/11/2016 CLINICAL DATA:  Cough EXAM: CHEST  2 VIEW COMPARISON:  Chest radiograph 07/07/2016 and thoracic spine radiograph 05/06/2016 FINDINGS: The heart size and mediastinal contours are within normal limits. Both lungs are clear. The visualized skeletal structures are unremarkable. IMPRESSION: Unchanged appearance the chest without active cardiopulmonary disease. Electronically Signed   By: Deatra Robinson M.D.   On: 09/11/2016 15:37    Procedures Procedures (including critical care time)  Medications Ordered in ED Medications  benzonatate (TESSALON) capsule 200 mg (not administered)  albuterol (PROVENTIL HFA;VENTOLIN HFA) 108 (90 Base) MCG/ACT inhaler 2 puff (not administered)     Initial Impression / Assessment and Plan / ED Course  I have reviewed the triage vital signs and the nursing notes.  Pertinent labs & imaging results that were available during my care of the  patient were reviewed by me and considered in my medical decision making (see chart for details).     cxr reviewed, exam suggesting viral uri. Given h/o DM and sleep apnea will cover for bronchitis/z pack, tessalon, albuterol mdi given. Advised coricidin for congestion given htn.  Final Clinical Impressions(s) / ED Diagnoses   Final diagnoses:  Viral URI with cough    New Prescriptions New Prescriptions   AZITHROMYCIN (ZITHROMAX Z-PAK) 250 MG TABLET    Take 2 tablets by mouth on day one followed by one tablet daily for 4 days.   BENZONATATE (TESSALON) 100 MG CAPSULE    Take 2 capsules (200 mg total) by mouth 3 (three) times daily as needed.     Burgess Amor, PA-C 09/11/16 1650    Donnetta Hutching, MD 09/12/16 [phone]

## 2016-09-11 NOTE — ED Triage Notes (Signed)
Pt c/o cough. Chest hurting form coughing. Runny nose. Nad. Lung sounds clear.

## 2016-09-15 ENCOUNTER — Ambulatory Visit (INDEPENDENT_AMBULATORY_CARE_PROVIDER_SITE_OTHER): Payer: Medicare Other | Admitting: Orthopaedic Surgery

## 2016-09-15 ENCOUNTER — Encounter (HOSPITAL_COMMUNITY): Payer: Self-pay | Admitting: *Deleted

## 2016-09-15 ENCOUNTER — Emergency Department (HOSPITAL_COMMUNITY)
Admission: EM | Admit: 2016-09-15 | Discharge: 2016-09-16 | Disposition: A | Discharge status: Home / Self Care | Payer: Medicare Other | Attending: Emergency Medicine | Admitting: Emergency Medicine

## 2016-09-15 ENCOUNTER — Emergency Department (HOSPITAL_COMMUNITY): Payer: Medicare Other

## 2016-09-15 ENCOUNTER — Encounter: Payer: Self-pay | Admitting: Orthopaedic Surgery

## 2016-09-15 VITALS — BP 144/89 | HR 98 | Temp 97.5°F | Ht 72.0 in

## 2016-09-15 DIAGNOSIS — Z79899 Other long term (current) drug therapy: Secondary | ICD-10-CM | POA: Insufficient documentation

## 2016-09-15 DIAGNOSIS — J069 Acute upper respiratory infection, unspecified: Secondary | ICD-10-CM | POA: Diagnosis not present

## 2016-09-15 DIAGNOSIS — M545 Low back pain, unspecified: Secondary | ICD-10-CM

## 2016-09-15 DIAGNOSIS — J40 Bronchitis, not specified as acute or chronic: Secondary | ICD-10-CM

## 2016-09-15 DIAGNOSIS — I10 Essential (primary) hypertension: Secondary | ICD-10-CM | POA: Insufficient documentation

## 2016-09-15 DIAGNOSIS — G8929 Other chronic pain: Secondary | ICD-10-CM

## 2016-09-15 DIAGNOSIS — Z7984 Long term (current) use of oral hypoglycemic drugs: Secondary | ICD-10-CM | POA: Diagnosis not present

## 2016-09-15 DIAGNOSIS — R0602 Shortness of breath: Secondary | ICD-10-CM | POA: Diagnosis present

## 2016-09-15 MED ORDER — OXYCODONE-ACETAMINOPHEN 7.5-325 MG PO TABS: 1.0000 | ORAL_TABLET | Freq: Four times a day (QID) | ORAL | 0 refills | Status: DC | PRN

## 2016-09-15 NOTE — Progress Notes (Signed)
Patient NW:GNFAOZ:Nathaniel Velasquez, male DOB:07/04/1980, [age] y.o. HYQ:[id number]  Chief Complaint  Patient presents with  . Follow-up    Chronic midline low back pain    HPI  Nathaniel Velasquez is a 36 y.o. male who has chronic lower back pain.  He has no new trauma. He has no sciatica.  He is massively obese. HPI  There is no height or weight on file to calculate BMI.  ROS  Review of Systems  HENT: Negative for congestion.   Respiratory: Negative for shortness of breath.   Cardiovascular: Negative for chest pain.  Endocrine: Positive for cold intolerance.  Musculoskeletal: Positive for arthralgias, back pain, gait problem and myalgias.  Allergic/Immunologic: Positive for environmental allergies.    Past Medical History:  Diagnosis Date  . Hypertension   . Prediabetic coma [facility](HCC)     Past Surgical History:  Procedure Laterality Date  . TONSILLECTOMY      Family History  Problem Relation Age of Onset  . Diabetes Mother   . Heart failure Mother     Social History Social History  Substance Use Topics  . Smoking status: Never Smoker  . Smokeless tobacco: Never Used  . Alcohol use No    No Known Allergies  Current Outpatient Prescriptions  Medication Sig Dispense Refill  . azithromycin (ZITHROMAX Z-PAK) 250 MG tablet Take 2 tablets by mouth on day one followed by one tablet daily for 4 days. 6 each 0  . benzonatate (TESSALON) 100 MG capsule Take 2 capsules (200 mg total) by mouth 3 (three) times daily as needed. 30 capsule 0  . carvedilol (COREG) 12.5 MG tablet Take 6.25 mg by mouth 2 (two) times daily with a meal.    . chlorthalidone (HYGROTON) 25 MG tablet Take 25 mg by mouth at bedtime.    . cyclobenzaprine (FLEXERIL) 10 MG tablet Take 1 tablet (10 mg total) by mouth 2 (two) times daily as needed for muscle spasms. (Patient not taking: Reported on 07/07/2016) 20 tablet 0  . diclofenac (VOLTAREN) 75 MG EC tablet Take 1 tablet (75 mg total) by mouth 2 (two) times daily. (Patient  not taking: Reported on 07/07/2016) 14 tablet 0  . esomeprazole (NEXIUM) 40 MG capsule Take 40 mg by mouth daily.    Marland Kitchen. lisinopril (PRINIVIL,ZESTRIL) 10 MG tablet Take 2 tablets by mouth daily.  11  . metFORMIN (GLUCOPHAGE) 500 MG tablet Take 2 tablets by mouth 2 (two) times daily.  1  . oxyCODONE-acetaminophen (PERCOCET) 7.5-325 MG tablet Take 1 tablet by mouth every 6 (six) hours as needed for moderate pain or severe pain (Must last 30 days.Do not take and drive a car or operate machinery). 130 tablet 0  . sitaGLIPtin (JANUVIA) 25 MG tablet Take 25 mg by mouth daily.    . traMADol (ULTRAM) 50 MG tablet Take 1 tablet (50 mg total) by mouth every 6 (six) hours as needed. 15 tablet 0   No current facility-administered medications for this visit.      Physical Exam  Blood pressure (!) 144/89, pulse 98, temperature 97.5 F (36.4 C), height 6' (1.829 m).  Constitutional: overall normal hygiene, normal nutrition, well developed, normal grooming, normal body habitus. Assistive device:none  Musculoskeletal: gait and station Limp none, muscle tone and strength are normal, no tremors or atrophy is present.  .  Neurological: coordination overall normal.  Deep tendon reflex/nerve stretch intact.  Sensation normal.  Cranial nerves II-XII intact.   Skin:   Normal overall no scars, lesions, ulcers or  rashes. No psoriasis.  Psychiatric: Alert and oriented x 3.  Recent memory intact, remote memory unclear.  Normal mood and affect. Well groomed.  Good eye contact.  Cardiovascular: overall no swelling, no varicosities, no edema bilaterally, normal temperatures of the legs and arms, no clubbing, cyanosis and good capillary refill.  Lymphatic: palpation is normal.  Spine/Pelvis examination:  Inspection:  Overall, sacoiliac joint benign and hips nontender; without crepitus or defects.   Thoracic spine inspection: Alignment normal without kyphosis present   Lumbar spine inspection:  Alignment  with  normal lumbar lordosis, without scoliosis apparent.   Thoracic spine palpation:  without tenderness of spinal processes   Lumbar spine palpation: with tenderness of lumbar area; without tightness of lumbar muscles    Range of Motion:   Lumbar flexion, forward flexion is 20 without pain or tenderness    Lumbar extension is 5 without pain or tenderness   Left lateral bend is Normal  without pain or tenderness   Right lateral bend is Normal without pain or tenderness   Straight leg raising is Normal   Strength & tone: Normal   Stability overall normal stability     The patient has been educated about the nature of the problem(s) and counseled on treatment options.  The patient appeared to understand what I have discussed and is in agreement with it.  Encounter Diagnoses  Name Primary?  . 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 Morbid obesity due to excess calories [facility])     PLAN Call if any problems.  Precautions discussed.  Continue current medications.   Return to clinic 3 months   I have reviewed the [facility] Controlled Substance Reporting System web site prior to prescribing narcotic medicine for this patient.  Electronically Signed Nathaniel Mclean, MD 6/12/20188:49 AM

## 2016-09-15 NOTE — ED Notes (Signed)
Pt states he still feel SOB & has a cough. Pt seen here on the 8th, dx viral URI and started on z pack. [facility]/ [facility]. Was told needed to follow up.

## 2016-09-15 NOTE — ED Provider Notes (Signed)
[facility]-EMERGENCY DEPT Provider Note   CSN: [id number] Arrival date & time: 09/15/16  2042     History   Chief Complaint Chief Complaint  Patient presents with  . Shortness of Breath    HPI Nathaniel Velasquez is a 36 y.o. male.  Pt is a 35y/o male who presents to the ED with cough and SOB. Pt has been sick since June 8th. He was seen in ED and started on zithromax. He was seen at [facility] yesterday. There was question of pneumonia on the xray. Pt was told to check in the ED tonight for recheck. No hemoptysis. No high fever. No LOC. No mental status changes.He feels like he has nasal congestion and  SOB. He request to be re-evaluated.      Past Medical History:  Diagnosis Date  . Hypertension   . Prediabetic coma [facility])     Patient Active Problem List   Diagnosis Date Noted  . PALPITATIONS 06/20/2009  . OBESITY, UNSPECIFIED 06/19/2009  . HYPERTENSION 06/19/2009  . ACID REFLUX DISEASE 06/19/2009    Past Surgical History:  Procedure Laterality Date  . TONSILLECTOMY         Home Medications    Prior to Admission medications   Medication Sig Start Date End Date Taking? Authorizing Provider  benzonatate (TESSALON) 100 MG capsule Take 2 capsules (200 mg total) by mouth 3 (three) times daily as needed. 09/11/16  Yes Idol, Raynelle Fanning, PA-C  carvedilol (COREG) 6.25 MG tablet Take 12.5 mg by mouth 2 (two) times daily with a meal.    Yes [provider]  chlorthalidone (HYGROTON) 25 MG tablet Take 25 mg by mouth at bedtime.   Yes [provider]  lisinopril (PRINIVIL,ZESTRIL) 30 MG tablet Take 30 mg by mouth daily. 08/07/16  Yes [provider]  metFORMIN (GLUCOPHAGE) 500 MG tablet Take 2 tablets by mouth 2 (two) times daily. 12/06/14  Yes [provider]  oxyCODONE-acetaminophen (PERCOCET) 7.5-325 MG tablet Take 1 tablet by mouth every 6 (six) hours as needed for moderate pain or severe pain (Must last 30 days.Do not take and drive a car or operate  machinery). 09/15/16  Yes Darreld Mclean, MD  sitaGLIPtin (JANUVIA) 25 MG tablet Take 25 mg by mouth daily. 03/11/16 03/11/17 Yes [provider]  azithromycin (ZITHROMAX Z-PAK) 250 MG tablet Take 2 tablets by mouth on day one followed by one tablet daily for 4 days. Patient not taking: Reported on 09/15/2016 09/11/16   Burgess Amor, PA-C  traMADol (ULTRAM) 50 MG tablet Take 1 tablet (50 mg total) by mouth every 6 (six) hours as needed. Patient not taking: Reported on 09/15/2016 07/07/16   Bethann Berkshire, MD    Family History Family History  Problem Relation Age of Onset  . Diabetes Mother   . Heart failure Mother     Social History Social History  Substance Use Topics  . Smoking status: Never Smoker  . Smokeless tobacco: Never Used  . Alcohol use No     Allergies   Patient has no known allergies.   Review of Systems Review of Systems  Constitutional: Negative for activity change and appetite change.  HENT: Positive for congestion. Negative for ear discharge, ear pain, facial swelling, nosebleeds, rhinorrhea, sneezing and tinnitus.   Eyes: Negative for photophobia, pain and discharge.  Respiratory: Positive for cough, shortness of breath and wheezing. Negative for choking.   Cardiovascular: Negative for chest pain, palpitations and leg swelling.  Gastrointestinal: Negative for abdominal pain, blood in stool, constipation,  diarrhea, nausea and vomiting.  Genitourinary: Negative for difficulty urinating, dysuria, flank pain, frequency and hematuria.  Musculoskeletal: Negative for back pain, gait problem, myalgias and neck pain.  Skin: Negative for color change, rash and wound.  Neurological: Negative for dizziness, seizures, syncope, facial asymmetry, speech difficulty, weakness and numbness.  Hematological: Negative for adenopathy. Does not bruise/bleed easily.  Psychiatric/Behavioral: Negative for agitation, confusion, hallucinations, self-injury and suicidal ideas. The  patient is not nervous/anxious.      Physical Exam Updated Vital Signs BP (!) 156/92   Pulse 76   Temp 97.7 F (36.5 C) (Oral)   Resp 20   Ht 6' (1.829 m)   Wt (!) 292.1 kg (644 lb)   SpO2 100%   BMI 87.34 kg/m   Physical Exam  Constitutional: He is oriented to person, place, and time. He appears well-developed and well-nourished.  Non-toxic appearance.  Patient is morbidly obese  HENT:  Head: Normocephalic.  Right Ear: Tympanic membrane and external ear normal.  Left Ear: Tympanic membrane and external ear normal.  Eyes: EOM and lids are normal. Pupils are equal, round, and reactive to light.  Neck: Normal range of motion. Neck supple. Carotid bruit is not present.  Cardiovascular: Normal rate, regular rhythm, normal heart sounds, intact distal pulses and normal pulses.   Pulmonary/Chest: Breath sounds normal. No respiratory distress. He has no wheezes.  There is symmetrical rise and fall of the chest. The patient speaks in complete sentences. There are coarse breath sounds throughout the lung fields. No wheezing noted at this time.  Abdominal: Soft. Bowel sounds are normal. There is no tenderness. There is no guarding.  Musculoskeletal: Normal range of motion.  Lymphadenopathy:       Head (right side): No submandibular adenopathy present.       Head (left side): No submandibular adenopathy present.    He has no cervical adenopathy.  Neurological: He is alert and oriented to person, place, and time. He has normal strength. No cranial nerve deficit or sensory deficit.  Skin: Skin is warm and dry.  Psychiatric: He has a normal mood and affect. His speech is normal.  Nursing note and vitals reviewed.    ED Treatments / Results  Labs (all labs ordered are listed, but only abnormal results are displayed) Labs Reviewed  BASIC METABOLIC PANEL  CBC WITH DIFFERENTIAL/PLATELET  TROPONIN I    EKG  EKG Interpretation None       Radiology No results  found.  Procedures Procedures (including critical care time)  Medications Ordered in ED Medications - No data to display   Initial Impression / Assessment and Plan / ED Course  I have reviewed the triage vital signs and the nursing notes.  Pertinent labs & imaging results that were available during my care of the patient were reviewed by me and considered in my medical decision making (see chart for details).       Final Clinical Impressions(s) / ED Diagnoses MDM  vital signs reviewed. Chest xray suggest bronchitis, but no pneumonia or other acute issue. Lab work non-acute. Troponin Neg for acute event. Discussed exam and xray finding with the patient. Rx for atrovent nasal spray and Promethazine DM given to the patient. He has albuterol at home. He will follow up with Dr Carolanne Grumblinghelmiski for recheck.   Final diagnoses:  Bronchitis  Upper respiratory tract infection, unspecified type    New Prescriptions New Prescriptions   No medications on file     Ivery QualeBryant, Terrion Gencarelli, Cordelia Poche-C 09/16/16 [id number]  Gilda Crease, MD 09/16/16 [phone]

## 2016-09-15 NOTE — ED Triage Notes (Signed)
Pt states that [facility], was also seen in er at [facility] on 09/14/2016 for sob, cough, chest tightness, chest congestion, pt reports that he was diagnosed with possible pneumonia and was placed on a z-pack and tessalon pearls with no improvement in symptoms,

## 2016-09-16 LAB — BASIC METABOLIC PANEL
Anion gap: 11 (ref 5–15)
BUN: 12 mg/dL (ref 6–20)
CALCIUM: 8.9 mg/dL (ref 8.9–10.3)
CO2: 27 mmol/L (ref 22–32)
CREATININE: 0.89 mg/dL (ref 0.61–1.24)
Chloride: 101 mmol/L (ref 101–111)
GFR calc Af Amer: >60 mL/min (ref >60)
GFR calc non Af Amer: >60 mL/min (ref >60)
GLUCOSE: 111 mg/dL — AB (ref 65–99)
Potassium: 3.7 mmol/L (ref 3.5–5.1)
Sodium: 139 mmol/L (ref 135–145)

## 2016-09-16 LAB — CBC WITH DIFFERENTIAL/PLATELET
BASOS PCT: 0 %
Basophils Absolute: 0 10*3/uL (ref 0.0–0.1)
EOS ABS: 0.2 10*3/uL (ref 0.0–0.7)
Eosinophils Relative: 2 %
HEMATOCRIT: 35.1 % — AB (ref 39.0–52.0)
Hemoglobin: 11.1 g/dL — ABNORMAL LOW (ref 13.0–17.0)
Lymphocytes Relative: 37 %
Lymphs Abs: 3.1 10*3/uL (ref 0.7–4.0)
MCH: 24.8 pg — AB (ref 26.0–34.0)
MCHC: 31.6 g/dL (ref 30.0–36.0)
MCV: 78.3 fL (ref 78.0–100.0)
MONO ABS: 0.4 10*3/uL (ref 0.1–1.0)
MONOS PCT: 5 %
NEUTROS ABS: 4.6 10*3/uL (ref 1.7–7.7)
Neutrophils Relative %: 56 %
Platelets: 322 10*3/uL (ref 150–400)
RBC: 4.48 MIL/uL (ref 4.22–5.81)
RDW: 17.2 % — AB (ref 11.5–15.5)
WBC: 8.3 10*3/uL (ref 4.0–10.5)

## 2016-09-16 LAB — TROPONIN I: Troponin I: <0.03 ng/mL (ref <0.03)

## 2016-09-16 MED ORDER — PSEUDOEPHEDRINE HCL 60 MG PO TABS
60.0000 mg | ORAL_TABLET | Freq: Once | ORAL | Status: AC
  Administered 2016-09-16: 60 mg via ORAL
  Filled 2016-09-16: qty 1

## 2016-09-16 MED ORDER — PROMETHAZINE-DM 6.25-15 MG/5ML PO SYRP: 5.0000 mL | ORAL_SOLUTION | Freq: Four times a day (QID) | ORAL | 0 refills | Status: DC

## 2016-09-16 MED ORDER — IPRATROPIUM BROMIDE 0.06 % NA SOLN: 2.0000 | Freq: Four times a day (QID) | NASAL | 12 refills | Status: DC

## 2016-09-16 MED ORDER — IPRATROPIUM-ALBUTEROL 0.5-2.5 (3) MG/3ML IN SOLN
3.0000 mL | Freq: Once | RESPIRATORY_TRACT | Status: AC
  Administered 2016-09-16: 3 mL via RESPIRATORY_TRACT
  Filled 2016-09-16: qty 3

## 2016-09-16 NOTE — ED Notes (Signed)
Pt alert & oriented x4, stable gait. Patient given discharge instructions, paperwork & prescription(s). Patient  instructed to stop at the registration desk to finish any additional paperwork. Patient verbalized understanding. Pt left department w/ no further questions. 

## 2016-09-16 NOTE — Discharge Instructions (Signed)
Your vital signs are within normal limits. Your oxygen level is 96-100% on room air. Your chest x-ray today is negative for pneumonia. There does appear to be some bronchitis changes present. Please increase fluids. Finish your Zithromax Z-PAK. Use promethazine DM for cough. Use 2 sprays of the Atrovent nasal spray 4 times daily for congestion. Please see your primary physician for additional evaluation if not improving.

## 2016-10-13 ENCOUNTER — Telehealth: Payer: Self-pay | Admitting: Orthopaedic Surgery

## 2016-10-13 NOTE — Telephone Encounter (Signed)
Patient requests a refill on Oxycodone/Acetaminophen (Percocet) 5-325  Mgs.   Qty  130  Sig: Take 1 tablet by mouth every 6 (six) hours as needed for moderate pain or severe pain (Must last 30 days.Do not take and drive a car or operate machinery).

## 2016-10-14 MED ORDER — OXYCODONE-ACETAMINOPHEN 7.5-325 MG PO TABS: 1.0000 | ORAL_TABLET | Freq: Four times a day (QID) | ORAL | 0 refills | Status: DC | PRN

## 2016-11-16 ENCOUNTER — Telehealth: Payer: Self-pay | Admitting: Orthopaedic Surgery

## 2016-11-16 NOTE — Telephone Encounter (Signed)
Patient requests a refill on Oxycodone/Acetaminophen 7.5-325 mgs.  Qty  130  Sig: Take 1 tablet by mouth every 6 (six) hours as needed for moderate pain or severe pain (Must last 30 days.Do not take and drive a car or operate machinery).

## 2016-11-17 MED ORDER — OXYCODONE-ACETAMINOPHEN 7.5-325 MG PO TABS: 1.0000 | ORAL_TABLET | Freq: Four times a day (QID) | ORAL | 0 refills | Status: DC | PRN

## 2016-12-16 ENCOUNTER — Ambulatory Visit (INDEPENDENT_AMBULATORY_CARE_PROVIDER_SITE_OTHER): Payer: Medicare Other | Admitting: Orthopaedic Surgery

## 2016-12-16 VITALS — BP 140/91 | HR 76 | Temp 97.0°F | Ht 72.0 in

## 2016-12-16 DIAGNOSIS — G8929 Other chronic pain: Secondary | ICD-10-CM | POA: Diagnosis not present

## 2016-12-16 DIAGNOSIS — M545 Low back pain, unspecified: Secondary | ICD-10-CM

## 2016-12-16 MED ORDER — OXYCODONE-ACETAMINOPHEN 7.5-325 MG PO TABS: 1.0000 | ORAL_TABLET | Freq: Four times a day (QID) | ORAL | 0 refills | Status: DC | PRN

## 2016-12-16 NOTE — Progress Notes (Signed)
Nathaniel Velasquez, male DOB:09-29-1980, 36 y.o. VWU:[id number]  Chief Complaint  Nathaniel presents with  . Follow-up    chronic pain    HPI  Nathaniel Velasquez is a 36 y.o. male who has chronic lower back pain.  He is stable.  He has no paresthesias.  He has no new trauma.  He is morbidly obese and we discussed weight reduction again today. HPI  There is no height or weight on file to calculate BMI.  ROS  Review of Systems  HENT: Negative for congestion.   Respiratory: Negative for shortness of breath.   Cardiovascular: Negative for chest pain.  Endocrine: Positive for cold intolerance.  Musculoskeletal: Positive for arthralgias, back pain, gait problem and myalgias.  Allergic/Immunologic: Positive for environmental allergies.    Past Medical History:  Diagnosis Date  . Hypertension   . Prediabetic coma [facility])     Past Surgical History:  Procedure Laterality Date  . TONSILLECTOMY      Family History  Problem Relation Age of Onset  . Diabetes Mother   . Heart failure Mother     Social History Social History  Substance Use Topics  . Smoking status: Never Smoker  . Smokeless tobacco: Never Used  . Alcohol use No    No Known Allergies  Current Outpatient Prescriptions  Medication Sig Dispense Refill  . azithromycin (ZITHROMAX Z-PAK) 250 MG tablet Take 2 tablets by mouth on day one followed by one tablet daily for 4 days. (Nathaniel not taking: Reported on 09/15/2016) 6 each 0  . benzonatate (TESSALON) 100 MG capsule Take 2 capsules (200 mg total) by mouth 3 (three) times daily as needed. 30 capsule 0  . carvedilol (COREG) 6.25 MG tablet Take 12.5 mg by mouth 2 (two) times daily with a meal.     . chlorthalidone (HYGROTON) 25 MG tablet Take 25 mg by mouth at bedtime.    Marland Kitchen ipratropium (ATROVENT) 0.06 % nasal spray Place 2 sprays into both nostrils 4 (four) times daily. 15 mL 12  . lisinopril (PRINIVIL,ZESTRIL) 30 MG tablet Take 30 mg by mouth daily.  11  . metFORMIN  (GLUCOPHAGE) 500 MG tablet Take 2 tablets by mouth 2 (two) times daily.  1  . oxyCODONE-acetaminophen (PERCOCET) 7.5-325 MG tablet Take 1 tablet by mouth every 6 (six) hours as needed for moderate pain or severe pain (Must last 30 days.Do not take and drive a car or operate machinery). 130 tablet 0  . promethazine-dextromethorphan (PROMETHAZINE-DM) 6.25-15 MG/5ML syrup Take 5 mLs by mouth every 6 (six) hours. 120 mL 0  . sitaGLIPtin (JANUVIA) 25 MG tablet Take 25 mg by mouth daily.    . traMADol (ULTRAM) 50 MG tablet Take 1 tablet (50 mg total) by mouth every 6 (six) hours as needed. (Nathaniel not taking: Reported on 09/15/2016) 15 tablet 0   No current facility-administered medications for this visit.      Physical Exam  Blood pressure (!) 140/91, pulse 76, temperature (!) 97 F (36.1 C), height 6' (1.829 m).  Constitutional: overall normal hygiene, normal nutrition, well developed, normal grooming, normal body habitus. Assistive device:none  Musculoskeletal: gait and station Limp none, muscle tone and strength are normal, no tremors or atrophy is present.  .  Neurological: coordination overall normal.  Deep tendon reflex/nerve stretch intact.  Sensation normal.  Cranial nerves II-XII intact.   Skin:   Normal overall no scars, lesions, ulcers or rashes. No psoriasis.  Psychiatric: Alert and oriented x 3.  Recent memory intact,  remote memory unclear.  Normal mood and affect. Well groomed.  Good eye contact.  Cardiovascular: overall no swelling, no varicosities, no edema bilaterally, normal temperatures of the legs and arms, no clubbing, cyanosis and good capillary refill.  Lymphatic: palpation is normal.  All other systems reviewed and are negative   Spine/Pelvis examination:  Inspection:  Overall, sacoiliac joint benign and hips nontender; without crepitus or defects.   Thoracic spine inspection: Alignment normal without kyphosis present   Lumbar spine inspection:  Alignment   with normal lumbar lordosis, without scoliosis apparent.   Thoracic spine palpation:  without tenderness of spinal processes   Lumbar spine palpation: with tenderness of lumbar area; without tightness of lumbar muscles    Range of Motion:   Lumbar flexion, forward flexion is 20 without pain or tenderness    Lumbar extension is 5 without pain or tenderness   Left lateral bend is Normal  without pain or tenderness   Right lateral bend is Normal without pain or tenderness   Straight leg raising is Normal   Strength & tone: Normal   Stability overall normal stability    The Nathaniel has been educated about the nature of the problem(s) and counseled on treatment options.  The Nathaniel appeared to understand what I have discussed and is in agreement with it.  Encounter Diagnoses  Name Primary?  . 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 Morbid obesity due to excess calories [facility](HCC)     PLAN Call if any problems.  Precautions discussed.  Continue current medications.   Return to clinic 3 months   I have reviewed the [facility] Cont[place] Sub[facility] Reporting [place] web site prior to prescribing narcotic medicine for this Nathaniel.  Electronically Signed Darreld McleanWayne Emberleigh Reily, MD 9/12/20188:35 AM

## 2017-01-13 ENCOUNTER — Telehealth: Payer: Self-pay | Admitting: Orthopaedic Surgery

## 2017-01-13 MED ORDER — OXYCODONE-ACETAMINOPHEN 7.5-325 MG PO TABS: 1.0000 | ORAL_TABLET | Freq: Four times a day (QID) | ORAL | 0 refills | Status: DC | PRN

## 2017-01-13 NOTE — Telephone Encounter (Signed)
Refill request on Oxycodone/Acetaminophen 7.5-325  Mgs.  Qty. 130  Sig: Take 1 tablet by mouth every 6 (six) hours as needed for moderate pain or severe pain (Must last 30 days.Do not take and drive a car or operate machinery).

## 2017-01-25 ENCOUNTER — Ambulatory Visit (INDEPENDENT_AMBULATORY_CARE_PROVIDER_SITE_OTHER): Payer: Medicare Other | Admitting: Podiatry

## 2017-01-25 ENCOUNTER — Ambulatory Visit (INDEPENDENT_AMBULATORY_CARE_PROVIDER_SITE_OTHER): Payer: Medicare Other

## 2017-01-25 VITALS — BP 128/79 | HR 70 | Ht 72.0 in | Wt >= 6400 oz

## 2017-01-25 DIAGNOSIS — M79676 Pain in unspecified toe(s): Secondary | ICD-10-CM

## 2017-01-25 DIAGNOSIS — L989 Disorder of the skin and subcutaneous tissue, unspecified: Secondary | ICD-10-CM | POA: Diagnosis not present

## 2017-01-25 DIAGNOSIS — E0842 Diabetes mellitus due to underlying condition with diabetic polyneuropathy: Secondary | ICD-10-CM | POA: Diagnosis not present

## 2017-01-25 DIAGNOSIS — B351 Tinea unguium: Secondary | ICD-10-CM | POA: Diagnosis not present

## 2017-01-25 DIAGNOSIS — M79672 Pain in left foot: Secondary | ICD-10-CM

## 2017-01-25 NOTE — Progress Notes (Signed)
   Subjective:    Patient ID: Nathaniel Velasquez, male    DOB: 06/14/1980, 36 y.o.   MRN: [id number]  HPI  Chief Complaint  Patient presents with  . Diabetes    bilateral foot exam/nail trim  . Foot Injury    left foot, stepped on a tack x 6 months ago/lateral       Review of Systems  All other systems reviewed and are negative.      Objective:   Physical Exam        Assessment & Plan:

## 2017-01-27 NOTE — Progress Notes (Signed)
    Subjective: Patient is a 36 y.o. male presenting to the office today with a chief complaint of a painful callus lesion to the left foot that has been present for approximately 6 months. He states he previously had a tack or staple stuck in the foot. Patient also complains of elongated, thickened nails that cause pain while ambulating in shoes. Patient is unable to trim their own nails. Patient presents today for further treatment and evaluation.   Past Medical History:  Diagnosis Date  . Hypertension   . Prediabetic coma (HCC)     Objective:  Physical Exam General: Alert and oriented x3 in no acute distress  Dermatology: Hyperkeratotic lesion present on the left foot. Pain on palpation with a central nucleated core noted. Skin is warm, dry and supple bilateral lower extremities. Negative for open lesions or macerations. Nails are tender, long, thickened and dystrophic with subungual debris, consistent with onychomycosis, 1-5 bilateral. No signs of infection noted.  Vascular: Palpable pedal pulses bilaterally. No edema or erythema noted. Capillary refill within normal limits.  Neurological: Epicritic and protective threshold grossly intact bilaterally.   Musculoskeletal Exam: Pain on palpation at the keratotic lesion noted. Range of motion within normal limits bilateral. Muscle strength 5/5 in all groups bilateral.  Radiographic Exam:  Normal osseous mineralization. Joint spaces preserved. No fracture/dislocation/boney destruction.   Assessment: 1. Onychodystrophic nails 1-5 bilateral with hyperkeratosis of nails.  2. Onychomycosis of nail due to dermatophyte bilateral 3. Porokeratosis to the left foot   Plan of Care:  #1 Patient evaluated. #2 Excisional debridement of keratoic lesion using a chisel blade was performed without incident.  #3 Dressed with light dressing. #4 Mechanical debridement of nails 1-5 bilaterally performed using a nail nipper. Filed with dremel without  incident.  #5 Patient is to return to the clinic in 3 months.   Felecia ShellingBrent M. Evans, DPM [facility]  Dr. Felecia ShellingBrent M. Evans, DPM    [address]                                        [place], [place] [address]                Office [phone]  Fax [phone]

## 2017-02-16 ENCOUNTER — Telehealth: Payer: Self-pay | Admitting: Orthopaedic Surgery

## 2017-02-16 MED ORDER — OXYCODONE-ACETAMINOPHEN 7.5-325 MG PO TABS: 1.0000 | ORAL_TABLET | Freq: Four times a day (QID) | ORAL | 0 refills | Status: DC | PRN

## 2017-02-16 NOTE — Telephone Encounter (Signed)
Oxycodone-Acetaminophen  7.5/325 mg    Qty 130 Tablets

## 2017-03-17 ENCOUNTER — Ambulatory Visit (INDEPENDENT_AMBULATORY_CARE_PROVIDER_SITE_OTHER): Payer: Medicare Other | Admitting: Orthopaedic Surgery

## 2017-03-17 ENCOUNTER — Encounter: Payer: Self-pay | Admitting: Orthopaedic Surgery

## 2017-03-17 VITALS — BP 155/91 | HR 74 | Temp 98.1°F | Ht 72.0 in | Wt >= 6400 oz

## 2017-03-17 DIAGNOSIS — M545 Low back pain: Secondary | ICD-10-CM

## 2017-03-17 DIAGNOSIS — G8929 Other chronic pain: Secondary | ICD-10-CM | POA: Diagnosis not present

## 2017-03-17 MED ORDER — OXYCODONE-ACETAMINOPHEN 7.5-325 MG PO TABS: 1.0000 | ORAL_TABLET | Freq: Four times a day (QID) | ORAL | 0 refills | Status: DC | PRN

## 2017-03-17 NOTE — Progress Notes (Signed)
Patient ZO:XWRUEA:Nathaniel Velasquez, male DOB:05/15/1980, 36 y.o. VWU:[id number]  Chief Complaint  Patient presents with  . Back Pain  . Knee Pain    left    HPI  Nathaniel Velasquez is a 36 y.o. male who has chronic lower back pain.  He has good and bad days.  The cold weather has made it worse.  He has no new trauma.   He is working and taking his medicine.  He has no paresthesias.   HPI  Body mass index is 81.37 kg/m.  ROS  Review of Systems  HENT: Negative for congestion.   Respiratory: Negative for shortness of breath.   Cardiovascular: Negative for chest pain.  Endocrine: Positive for cold intolerance.  Musculoskeletal: Positive for arthralgias, back pain, gait problem and myalgias.  Allergic/Immunologic: Positive for environmental allergies.  All other systems reviewed and are negative.   Past Medical History:  Diagnosis Date  . Hypertension   . Prediabetic coma [facility](HCC)     Past Surgical History:  Procedure Laterality Date  . TONSILLECTOMY      Family History  Problem Relation Age of Onset  . Diabetes Mother   . Heart failure Mother     Social History Social History   Tobacco Use  . Smoking status: Never Smoker  . Smokeless tobacco: Never Used  Substance Use Topics  . Alcohol use: No  . Drug use: No    No Known Allergies  Current Outpatient Medications  Medication Sig Dispense Refill  . carvedilol (COREG) 6.25 MG tablet Take 12.5 mg by mouth 2 (two) times daily with a meal.     . chlorthalidone (HYGROTON) 25 MG tablet Take 25 mg by mouth at bedtime.    Marland Kitchen. ipratropium (ATROVENT) 0.06 % nasal spray Place 2 sprays into both nostrils 4 (four) times daily. 15 mL 12  . lisinopril (PRINIVIL,ZESTRIL) 30 MG tablet Take 30 mg by mouth daily.  11  . metFORMIN (GLUCOPHAGE) 500 MG tablet Take 2 tablets by mouth 2 (two) times daily.  1  . oxyCODONE-acetaminophen (PERCOCET) 7.5-325 MG tablet Take 1 tablet by mouth every 6 (six) hours as needed for moderate pain or severe pain  (Must last 30 days.Do not take and drive a car or operate machinery). 130 tablet 0  . sitaGLIPtin (JANUVIA) 25 MG tablet Take 25 mg by mouth daily.    . traMADol (ULTRAM) 50 MG tablet Take 1 tablet (50 mg total) by mouth every 6 (six) hours as needed. (Patient not taking: Reported on 09/15/2016) 15 tablet 0   No current facility-administered medications for this visit.      Physical Exam  Blood pressure (!) 155/91, pulse 74, temperature 98.1 F (36.7 C), height 6' (1.829 m), weight (!) 600 lb (272.2 kg).  Constitutional: overall normal hygiene, normal nutrition, well developed, normal grooming, normal body habitus. Assistive device:none  Musculoskeletal: gait and station Limp none, muscle tone and strength are normal, no tremors or atrophy is present.  .  Neurological: coordination overall normal.  Deep tendon reflex/nerve stretch intact.  Sensation normal.  Cranial nerves II-XII intact.   Skin:   Normal overall no scars, lesions, ulcers or rashes. No psoriasis.  Psychiatric: Alert and oriented x 3.  Recent memory intact, remote memory unclear.  Normal mood and affect. Well groomed.  Good eye contact.  Cardiovascular: overall no swelling, no varicosities, no edema bilaterally, normal temperatures of the legs and arms, no clubbing, cyanosis and good capillary refill.  Lymphatic: palpation is normal.  All other  systems reviewed and are negative   Spine/Pelvis examination:  Inspection:  Overall, sacoiliac joint benign and hips nontender; without crepitus or defects.   Thoracic spine inspection: Alignment normal without kyphosis present   Lumbar spine inspection:  Alignment  with normal lumbar lordosis, without scoliosis apparent.   Thoracic spine palpation:  without tenderness of spinal processes   Lumbar spine palpation: with tenderness of lumbar area; without tightness of lumbar muscles    Range of Motion:   Lumbar flexion, forward flexion is 20 without pain or  tenderness    Lumbar extension is 5 without pain or tenderness   Left lateral bend is Normal  without pain or tenderness   Right lateral bend is Normal without pain or tenderness   Straight leg raising is Normal   Strength & tone: Normal   Stability overall normal stability    The patient has been educated about the nature of the problem(s) and counseled on treatment options.  The patient appeared to understand what I have discussed and is in agreement with it.  Encounter Diagnoses  Name Primary?  . 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 Morbid obesity due to excess calories [facility](HCC)     PLAN Call if any problems.  Precautions discussed.  Continue current medications.   Return to clinic 2 months   I have reviewed the [facility] Sub[facility] Reporting System web site prior to prescribing narcotic medicine for this patient.  Electronically Signed Darreld McleanWayne Laikyn Gewirtz, MD 12/12/20188:26 AM

## 2017-03-18 ENCOUNTER — Ambulatory Visit: Payer: Medicare Other | Admitting: Orthopaedic Surgery

## 2017-03-26 ENCOUNTER — Observation Stay (HOSPITAL_COMMUNITY)
Admission: EM | Admit: 2017-03-26 | Discharge: 2017-03-28 | Disposition: A | Discharge status: Home / Self Care | Payer: Medicare Other | Attending: Family Medicine | Admitting: Family Medicine

## 2017-03-26 ENCOUNTER — Encounter (HOSPITAL_COMMUNITY): Payer: Self-pay | Admitting: Emergency Medicine

## 2017-03-26 ENCOUNTER — Other Ambulatory Visit: Payer: Self-pay

## 2017-03-26 DIAGNOSIS — R42 Dizziness and giddiness: Secondary | ICD-10-CM | POA: Insufficient documentation

## 2017-03-26 DIAGNOSIS — E119 Type 2 diabetes mellitus without complications: Secondary | ICD-10-CM | POA: Diagnosis not present

## 2017-03-26 DIAGNOSIS — E785 Hyperlipidemia, unspecified: Secondary | ICD-10-CM | POA: Diagnosis not present

## 2017-03-26 DIAGNOSIS — R072 Precordial pain: Principal | ICD-10-CM | POA: Insufficient documentation

## 2017-03-26 DIAGNOSIS — K219 Gastro-esophageal reflux disease without esophagitis: Secondary | ICD-10-CM | POA: Insufficient documentation

## 2017-03-26 DIAGNOSIS — Z7984 Long term (current) use of oral hypoglycemic drugs: Secondary | ICD-10-CM | POA: Insufficient documentation

## 2017-03-26 DIAGNOSIS — Z79899 Other long term (current) drug therapy: Secondary | ICD-10-CM | POA: Insufficient documentation

## 2017-03-26 DIAGNOSIS — R0602 Shortness of breath: Secondary | ICD-10-CM | POA: Diagnosis not present

## 2017-03-26 DIAGNOSIS — R55 Syncope and collapse: Secondary | ICD-10-CM | POA: Diagnosis not present

## 2017-03-26 DIAGNOSIS — D649 Anemia, unspecified: Secondary | ICD-10-CM | POA: Diagnosis not present

## 2017-03-26 DIAGNOSIS — I10 Essential (primary) hypertension: Secondary | ICD-10-CM | POA: Diagnosis not present

## 2017-03-26 DIAGNOSIS — R079 Chest pain, unspecified: Secondary | ICD-10-CM | POA: Diagnosis present

## 2017-03-26 NOTE — ED Triage Notes (Signed)
Pt with a litany of complaints Initially complained of dizziness and shortness of breath Then stated he was having chest pain and felt he was having a heart attack Also forgetting things, having trouble with his vision

## 2017-03-27 ENCOUNTER — Emergency Department (HOSPITAL_COMMUNITY): Payer: Medicare Other

## 2017-03-27 ENCOUNTER — Encounter (HOSPITAL_COMMUNITY): Payer: Self-pay | Admitting: Internal Medicine

## 2017-03-27 ENCOUNTER — Other Ambulatory Visit: Payer: Self-pay

## 2017-03-27 ENCOUNTER — Observation Stay (HOSPITAL_COMMUNITY): Payer: Medicare Other

## 2017-03-27 ENCOUNTER — Observation Stay (HOSPITAL_BASED_OUTPATIENT_CLINIC_OR_DEPARTMENT_OTHER): Payer: Medicare Other

## 2017-03-27 DIAGNOSIS — R072 Precordial pain: Secondary | ICD-10-CM | POA: Diagnosis not present

## 2017-03-27 DIAGNOSIS — R55 Syncope and collapse: Secondary | ICD-10-CM | POA: Diagnosis not present

## 2017-03-27 DIAGNOSIS — R079 Chest pain, unspecified: Secondary | ICD-10-CM | POA: Diagnosis present

## 2017-03-27 DIAGNOSIS — D649 Anemia, unspecified: Secondary | ICD-10-CM | POA: Diagnosis not present

## 2017-03-27 DIAGNOSIS — R42 Dizziness and giddiness: Secondary | ICD-10-CM

## 2017-03-27 DIAGNOSIS — E119 Type 2 diabetes mellitus without complications: Secondary | ICD-10-CM

## 2017-03-27 LAB — ECHOCARDIOGRAM COMPLETE
AO mean calculated velocity dopler: 133 cm/s
AV Area VTI index: 0.73 cm2/m2
AV Area VTI: 3.08 cm2
AV VEL mean LVOT/AV: 0.76
AV area mean vel ind: 0.73 cm2/m2
AV pk vel: 196 cm/s
AVAREAMEANV: 2.89 cm2
AVG: 8 mmHg
AVPG: 15 mmHg
Ao pk vel: 0.81 m/s
CHL CUP AV PEAK INDEX: 0.78
CHL CUP AV VALUE AREA INDEX: 0.73
CHL CUP AV VEL: 2.88
E decel time: 246 msec
E/e' ratio: 11.62
FS: 40 % (ref 28–44)
Height: 72 in
IVS/LV PW RATIO, ED: 0.99
LA diam end sys: 47 mm
LADIAMINDEX: 1.18 cm/m2
LASIZE: 47 mm
LAVOL: 55.1 mL
LAVOLA4C: 81 mL
LAVOLIN: 13.9 mL/m2
LV E/e'average: 11.62
LV PW d: 13.8 mm — AB (ref 0.6–1.1)
LV TDI E'LATERAL: 11.1
LVEEMED: 11.62
LVELAT: 11.1 cm/s
LVOT VTI: 35.6 cm
LVOT area: 3.8 cm2
LVOT peak grad rest: 10 mmHg
LVOT peak vel: 159 cm/s
LVOTD: 22 mm
LVOTSV: 135 mL
LVOTVTI: 0.76 cm
Lateral S' vel: 18.3 cm/s
MV Dec: 246
MV Peak grad: 7 mmHg
MV pk A vel: 86.3 m/s
MV pk E vel: 129 m/s
TAPSE: 26.2 mm
TDI e' medial: 9.57
VTI: 47 cm
Valve area: 2.88 cm2
Weight: 9920 oz

## 2017-03-27 LAB — TROPONIN I
Troponin I: <0.03 ng/mL (ref <0.03)
Troponin I: <0.03 ng/mL (ref <0.03)

## 2017-03-27 LAB — COMPREHENSIVE METABOLIC PANEL
ALK PHOS: 52 U/L (ref 38–126)
ALT: 24 U/L (ref 17–63)
AST: 17 U/L (ref 15–41)
Albumin: 3.4 g/dL — ABNORMAL LOW (ref 3.5–5.0)
Anion gap: 11 (ref 5–15)
BILIRUBIN TOTAL: 0.3 mg/dL (ref 0.3–1.2)
BUN: 17 mg/dL (ref 6–20)
CALCIUM: 9.2 mg/dL (ref 8.9–10.3)
CHLORIDE: 101 mmol/L (ref 101–111)
CO2: 26 mmol/L (ref 22–32)
CREATININE: 0.88 mg/dL (ref 0.61–1.24)
GFR calc Af Amer: >60 mL/min (ref >60)
Glucose, Bld: 189 mg/dL — ABNORMAL HIGH (ref 65–99)
Potassium: 4.1 mmol/L (ref 3.5–5.1)
Sodium: 138 mmol/L (ref 135–145)
Total Protein: 7.8 g/dL (ref 6.5–8.1)

## 2017-03-27 LAB — RAPID URINE DRUG SCREEN, HOSP PERFORMED
AMPHETAMINES: NOT DETECTED
Amphetamines: NOT DETECTED
BARBITURATES: NOT DETECTED
Barbiturates: NOT DETECTED
Benzodiazepines: NOT DETECTED
Benzodiazepines: NOT DETECTED
Cocaine: NOT DETECTED
Cocaine: NOT DETECTED
OPIATES: NOT DETECTED
OPIATES: NOT DETECTED
TETRAHYDROCANNABINOL: POSITIVE — AB
TETRAHYDROCANNABINOL: POSITIVE — AB

## 2017-03-27 LAB — URINALYSIS, ROUTINE W REFLEX MICROSCOPIC
Bilirubin Urine: NEGATIVE
Glucose, UA: NEGATIVE mg/dL
Hgb urine dipstick: NEGATIVE
KETONES UR: NEGATIVE mg/dL
LEUKOCYTES UA: NEGATIVE
NITRITE: NEGATIVE
PH: 5 (ref 5.0–8.0)
PROTEIN: NEGATIVE mg/dL
Specific Gravity, Urine: 1.023 (ref 1.005–1.030)

## 2017-03-27 LAB — DIFFERENTIAL
BASOS ABS: 0 10*3/uL (ref 0.0–0.1)
BASOS PCT: 0 %
Eosinophils Absolute: 0.3 10*3/uL (ref 0.0–0.7)
Eosinophils Relative: 4 %
LYMPHS ABS: 2.2 10*3/uL (ref 0.7–4.0)
LYMPHS PCT: 26 %
MONO ABS: 0.5 10*3/uL (ref 0.1–1.0)
MONOS PCT: 6 %
NEUTROS ABS: 5.4 10*3/uL (ref 1.7–7.7)
Neutrophils Relative %: 64 %

## 2017-03-27 LAB — CBC
HEMATOCRIT: 35.3 % — AB (ref 39.0–52.0)
HEMOGLOBIN: 10.9 g/dL — AB (ref 13.0–17.0)
MCH: 24.7 pg — ABNORMAL LOW (ref 26.0–34.0)
MCHC: 30.9 g/dL (ref 30.0–36.0)
MCV: 80 fL (ref 78.0–100.0)
Platelets: 377 10*3/uL (ref 150–400)
RBC: 4.41 MIL/uL (ref 4.22–5.81)
RDW: 16.8 % — ABNORMAL HIGH (ref 11.5–15.5)
WBC: 8.5 10*3/uL (ref 4.0–10.5)

## 2017-03-27 LAB — GLUCOSE, CAPILLARY
GLUCOSE-CAPILLARY: 108 mg/dL — AB (ref 65–99)
Glucose-Capillary: 117 mg/dL — ABNORMAL HIGH (ref 65–99)
Glucose-Capillary: 100 mg/dL — ABNORMAL HIGH (ref 65–99)
Glucose-Capillary: 145 mg/dL — ABNORMAL HIGH (ref 65–99)

## 2017-03-27 LAB — APTT: aPTT: 33 seconds (ref 24–36)

## 2017-03-27 LAB — LIPASE, BLOOD: Lipase: 18 U/L (ref 11–51)

## 2017-03-27 LAB — ETHANOL: Alcohol, Ethyl (B): 2 mg/dL (ref <10)

## 2017-03-27 LAB — PROTIME-INR
INR: 1.04
PROTHROMBIN TIME: 13.6 s (ref 11.4–15.2)

## 2017-03-27 LAB — D-DIMER, QUANTITATIVE: D-Dimer, Quant: 0.47 ug/mL-FEU (ref 0.00–0.50)

## 2017-03-27 LAB — BRAIN NATRIURETIC PEPTIDE: B NATRIURETIC PEPTIDE 5: 8 pg/mL (ref 0.0–100.0)

## 2017-03-27 MED ORDER — ONDANSETRON HCL 4 MG/2ML IJ SOLN: 4.0000 mg | Freq: Four times a day (QID) | INTRAMUSCULAR | Status: DC | PRN

## 2017-03-27 MED ORDER — METFORMIN HCL 500 MG PO TABS
1000.0000 mg | ORAL_TABLET | Freq: Two times a day (BID) | ORAL | Status: DC
  Administered 2017-03-27 – 2017-03-28 (×3): 1000 mg via ORAL
  Filled 2017-03-27 (×3): qty 2

## 2017-03-27 MED ORDER — LISINOPRIL 10 MG PO TABS
30.0000 mg | ORAL_TABLET | Freq: Every day | ORAL | Status: DC
  Administered 2017-03-27 – 2017-03-28 (×2): 30 mg via ORAL
  Filled 2017-03-27 (×2): qty 3

## 2017-03-27 MED ORDER — STROKE: EARLY STAGES OF RECOVERY BOOK
Freq: Once | Status: AC
  Administered 2017-03-27: 10:00:00
  Filled 2017-03-27: qty 1

## 2017-03-27 MED ORDER — INSULIN ASPART 100 UNIT/ML ~~LOC~~ SOLN: 0.0000 [IU] | Freq: Three times a day (TID) | SUBCUTANEOUS | Status: DC

## 2017-03-27 MED ORDER — CARVEDILOL 12.5 MG PO TABS
12.5000 mg | ORAL_TABLET | Freq: Two times a day (BID) | ORAL | Status: DC
  Administered 2017-03-27 – 2017-03-28 (×3): 12.5 mg via ORAL
  Filled 2017-03-27 (×3): qty 1

## 2017-03-27 MED ORDER — ACETAMINOPHEN 325 MG PO TABS: 650.0000 mg | ORAL_TABLET | ORAL | Status: DC | PRN

## 2017-03-27 MED ORDER — OXYCODONE-ACETAMINOPHEN 7.5-325 MG PO TABS
1.0000 | ORAL_TABLET | Freq: Four times a day (QID) | ORAL | Status: DC | PRN
  Filled 2017-03-27: qty 1

## 2017-03-27 MED ORDER — PERFLUTREN LIPID MICROSPHERE
1.0000 mL | INTRAVENOUS | Status: AC | PRN
  Administered 2017-03-27: 10 mL via INTRAVENOUS
  Filled 2017-03-27: qty 10

## 2017-03-27 MED ORDER — TRAMADOL HCL 50 MG PO TABS: 50.0000 mg | ORAL_TABLET | Freq: Four times a day (QID) | ORAL | Status: DC | PRN

## 2017-03-27 MED ORDER — GI COCKTAIL ~~LOC~~
30.0000 mL | Freq: Once | ORAL | Status: AC
  Administered 2017-03-27: 30 mL via ORAL
  Filled 2017-03-27: qty 30

## 2017-03-27 MED ORDER — ACETAMINOPHEN 650 MG RE SUPP: 650.0000 mg | RECTAL | Status: DC | PRN

## 2017-03-27 MED ORDER — ENOXAPARIN SODIUM 40 MG/0.4ML ~~LOC~~ SOLN
40.0000 mg | SUBCUTANEOUS | Status: DC
  Administered 2017-03-27: 40 mg via SUBCUTANEOUS
  Filled 2017-03-27: qty 0.4

## 2017-03-27 MED ORDER — ENOXAPARIN SODIUM 150 MG/ML ~~LOC~~ SOLN
140.0000 mg | SUBCUTANEOUS | Status: DC
  Administered 2017-03-28: 140 mg via SUBCUTANEOUS
  Filled 2017-03-27: qty 1

## 2017-03-27 MED ORDER — IPRATROPIUM BROMIDE 0.03 % NA SOLN
2.0000 | Freq: Four times a day (QID) | NASAL | Status: DC
  Administered 2017-03-27 (×2): 2 via NASAL
  Filled 2017-03-27: qty 30

## 2017-03-27 MED ORDER — IPRATROPIUM BROMIDE 0.06 % NA SOLN
2.0000 | Freq: Four times a day (QID) | NASAL | Status: DC
  Filled 2017-03-27: qty 15

## 2017-03-27 MED ORDER — ACETAMINOPHEN 160 MG/5ML PO SOLN: 650.0000 mg | ORAL | Status: DC | PRN

## 2017-03-27 MED ORDER — LINAGLIPTIN 5 MG PO TABS
5.0000 mg | ORAL_TABLET | Freq: Every day | ORAL | Status: DC
  Administered 2017-03-27 – 2017-03-28 (×2): 5 mg via ORAL
  Filled 2017-03-27 (×2): qty 1

## 2017-03-27 MED ORDER — LACTATED RINGERS IV BOLUS (SEPSIS)
2000.0000 mL | Freq: Once | INTRAVENOUS | Status: AC
  Administered 2017-03-27: 2000 mL via INTRAVENOUS

## 2017-03-27 MED ORDER — CHLORTHALIDONE 25 MG PO TABS
25.0000 mg | ORAL_TABLET | Freq: Every day | ORAL | Status: DC
  Administered 2017-03-27: 25 mg via ORAL
  Filled 2017-03-27 (×2): qty 1

## 2017-03-27 NOTE — Plan of Care (Signed)
Stroke mapping early stages book given and discussed with patient. Will encourage compliance with goals of care.

## 2017-03-27 NOTE — ED Provider Notes (Signed)
[facility] EMERGENCY DEPARTMENT Provider Note   CSN: [id number] Arrival date & time: 03/26/17  2301     History   Chief Complaint Chief Complaint  Patient presents with  . Dizziness  . Chest Pain    HPI Nathaniel Velasquez is a 36 y.o. male.  HPI 36 year old male comes in with chief complaint of dizziness. Patient has history of hypertension.  Patient reports that he was driving about 3 hours before ER arrival when he suddenly got dizzy.  Patient has had constant dizziness since then.  Patient feels like he is having difficulty concentrating, and that he might faint.  Patient is also complaining of chest pain that is midsternal, and some shortness of breath.  Chest pain is described as pressure type pain.  Patient denies any history of coronary artery disease, congestive heart failure, PE.  Pt has no hx of PE, DVT and denies any exogenous hormone (testosterone / estrogen) use, long distance travels or surgery in the past 6 weeks, active cancer, recent immobilization.  Patient denies any substance abuse.  Patient has no history of similar symptoms in the past.  Review of system are negative for nausea, neck pain, neck stiffness, diarrhea, recent infection.   Past Medical History:  Diagnosis Date  . Hypertension   . Prediabetic coma [facility](HCC)     Patient Active Problem List   Diagnosis Date Noted  . Chest pain 03/27/2017  . Anemia 03/27/2017  . Dizziness 03/27/2017  . PALPITATIONS 06/20/2009  . OBESITY, UNSPECIFIED 06/19/2009  . Essential hypertension 06/19/2009  . GERD (gastroesophageal reflux disease) 06/19/2009    Past Surgical History:  Procedure Laterality Date  . TONSILLECTOMY         Home Medications    Prior to Admission medications   Medication Sig Start Date End Date Taking? Authorizing Provider  carvedilol (COREG) 6.25 MG tablet Take 12.5 mg by mouth 2 (two) times daily with a meal.     [provider]  chlorthalidone (HYGROTON) 25 MG tablet Take 25  mg by mouth at bedtime.    [provider]  ipratropium (ATROVENT) 0.06 % nasal spray Place 2 sprays into both nostrils 4 (four) times daily. 09/16/16   Ivery QualeBryant, Hobson, PA-C  lisinopril (PRINIVIL,ZESTRIL) 30 MG tablet Take 30 mg by mouth daily. 08/07/16   [provider]  metFORMIN (GLUCOPHAGE) 500 MG tablet Take 2 tablets by mouth 2 (two) times daily. 12/06/14   [provider]  oxyCODONE-acetaminophen (PERCOCET) 7.5-325 MG tablet Take 1 tablet by mouth every 6 (six) hours as needed for moderate pain or severe pain (Must last 30 days.Do not take and drive a car or operate machinery). 03/17/17   Darreld McleanKeeling, Wayne, MD  sitaGLIPtin (JANUVIA) 25 MG tablet Take 25 mg by mouth daily. 03/11/16 03/11/17  [provider]  traMADol (ULTRAM) 50 MG tablet Take 1 tablet (50 mg total) by mouth every 6 (six) hours as needed. Patient not taking: Reported on 09/15/2016 07/07/16   Bethann BerkshireZammit, Joseph, MD    Family History Family History  Problem Relation Age of Onset  . Diabetes Mother   . Heart failure Mother     Social History Social History   Tobacco Use  . Smoking status: Never Smoker  . Smokeless tobacco: Never Used  Substance Use Topics  . Alcohol use: No  . Drug use: No     Allergies   Patient has no known allergies.   Review of Systems Review of Systems  Constitutional: Positive for activity change.  Eyes: Negative for photophobia and visual disturbance.  Respiratory: Positive for shortness of breath.   Cardiovascular: Positive for chest pain.  Gastrointestinal: Positive for abdominal pain.  Skin: Negative for rash.  Neurological: Positive for dizziness and light-headedness. Negative for headaches.  All other systems reviewed and are negative.    Physical Exam Updated Vital Signs BP 107/79   Pulse 71   Temp 98.7 F (37.1 C)   Resp 20   Ht 6' (1.829 m)   Wt (!) 272.2 kg (600 lb)   SpO2 98%   BMI 81.37 kg/m   Physical Exam  Constitutional: He is  oriented to person, place, and time. He appears well-developed and well-nourished.  HENT:  Head: Normocephalic and atraumatic.  Eyes: EOM are normal. Pupils are equal, round, and reactive to light.  Neck: Normal range of motion. Neck supple. No JVD present.  Cardiovascular: Normal rate, regular rhythm, intact distal pulses and normal pulses.  Pulmonary/Chest: Effort normal and breath sounds normal. No respiratory distress. He has no wheezes.  Abdominal: Soft. Bowel sounds are normal. He exhibits no distension. There is no tenderness. There is no rebound and no guarding.  Musculoskeletal:       Right lower leg: He exhibits no tenderness and no edema.       Left lower leg: He exhibits no tenderness and no edema.  Neurological: He is alert and oriented to person, place, and time. He is not disoriented. No cranial nerve deficit. Coordination normal.  NIHSS - 0 No objective sensory deficits, Motor strength upper and lower extremity 4+ and equal Normal cerebellar exam (finger to nose).  Skin: Skin is warm and dry.  Vitals reviewed.    ED Treatments / Results  Labs (all labs ordered are listed, but only abnormal results are displayed) Labs Reviewed  CBC - Abnormal; Notable for the following components:      Result Value   Hemoglobin 10.9 (*)    HCT 35.3 (*)    MCH 24.7 (*)    RDW 16.8 (*)    All other components within normal limits  COMPREHENSIVE METABOLIC PANEL - Abnormal; Notable for the following components:   Glucose, Bld 189 (*)    Albumin 3.4 (*)    All other components within normal limits  RAPID URINE DRUG SCREEN, HOSP PERFORMED - Abnormal; Notable for the following components:   Tetrahydrocannabinol POSITIVE (*)    All other components within normal limits  ETHANOL  DIFFERENTIAL  URINALYSIS, ROUTINE W REFLEX MICROSCOPIC  TROPONIN I  BRAIN NATRIURETIC PEPTIDE  PROTIME-INR  APTT  D-DIMER, QUANTITATIVE (NOT AT [facility])  TROPONIN I  LIPASE, BLOOD    EKG  EKG  Interpretation  Date/Time:  Friday March 26 2017 23:22:19 EST Ventricular Rate:  93 PR Interval:    QRS Duration: 95 QT Interval:  357 QTC Calculation: 444 R Axis:   113 Text Interpretation:  Sinus rhythm Right axis deviation Low voltage, precordial leads Borderline T abnormalities, inferior leads No acute changes [id number] is not new Reconfirmed by Derwood Kaplan [phone]) on 03/27/2017 2:57:50 AM       Radiology Dg Chest 2 View  Result Date: 03/27/2017 CLINICAL DATA:  36 year old male with shortness of breath. EXAM: CHEST  2 VIEW COMPARISON:  Chest radiograph dated 09/15/2016 FINDINGS: The heart size and mediastinal contours are within normal limits. Both lungs are clear. The visualized skeletal structures are unremarkable. IMPRESSION: No active cardiopulmonary disease. Electronically Signed   By: Elgie Collard M.D.   On: 03/27/2017 02:25  Procedures Procedures (including critical care time)  Medications Ordered in ED Medications  lactated ringers bolus 2,000 mL (2,000 mLs Intravenous New Bag/Given 03/27/17 0304)  gi cocktail (Maalox,Lidocaine,Donnatal) (30 mLs Oral Given 03/27/17 0545)     Initial Impression / Assessment and Plan / ED Course  I have reviewed the triage vital signs and the nursing notes.  Pertinent labs & imaging results that were available during my care of the patient were reviewed by me and considered in my medical decision making (see chart for details).     Patient comes in with chief complaint of dizziness.  Patient is morbidly obese, and has history of hypertension.  Along with constant dizziness, patient also has chest discomfort with shortness of breath and he feels drowsy.  Patient has no other focal neurologic deficits.  Patient denies any substance abuse or prior symptoms of similar nature.  Differential diagnosis includes: Posterior Stroke - ischemic vs. hemorrhagic Electrolyte abnormality PE ACS Orthostatic dizziness Substance abuse /  medication side effects  Patient is morbidly obese and weighs 600 pounds.  Unfortunately outside of the weight limit that can be tolerated by CT scan within the [facility].  We will start with basic workup and reassess. Given the confounding presenting symptoms and limitations of the workup and with symptoms being not extremely severe, I doubt pt will be TPA candidate.   2:30 am: Patient's initial workup, which includes troponin and d-dimer is negative.  Electrolytes are within normal limit as well.  Orthostatics are positive.  2 L of fluid ordered.  Patient's symptoms have persisted, specifically the dizziness.  Discussed case with neurology.  Informed them that patient will not be able to get CT scan or MRI.  They recommend that if the symptoms continue despite fluid, then to consider admission and PT OT.  Dr. Laurence SlateAroor agreed, pt is not a TPA candidate.  5:30 am: Patient is now status post fluids.  Patient continues to have symptoms.  He was ambulated, and he felt dizzy.  We will admit to medicine.  Final Clinical Impressions(s) / ED Diagnoses   Final diagnoses:  Dizziness  Precordial chest pain  Near syncope  Morbid obesity [facility](HCC)    ED Discharge Orders    None       Derwood KaplanNanavati, Kay Ricciuti, MD 03/27/17 [phone]

## 2017-03-27 NOTE — Progress Notes (Signed)
Patient seen and examined, database reviewed.  Discussed with mother at bedside and all questions answered.  Patient admitted earlier today due to dizziness and shortness of breath as well as mild chest pain.  He has now ruled out for ACS.  There was concern that dizziness could be due to pontine stroke, however patient's body habitus does not allow MRI scan.  Stroke workup is in process including echo, Dopplers.  Patient also has a UDS that is positive for THC.  He adamantly denies use and in fact requested a second UDS which was done and was also positive for cannabis.  Wonder if this may be playing a role in his dizziness.  We will request PT evaluation for tomorrow morning and anticipate discharge home in 24 hours.  Peggye PittEstela Hernandez, MD [facility] Pager: [phone]

## 2017-03-27 NOTE — Progress Notes (Signed)
*  PRELIMINARY RESULTS* Echocardiogram 2D Echocardiogram has been performed with Definity.  Stacey DrainWhite, Vanilla Heatherington J 03/27/2017, 12:40 PM

## 2017-03-27 NOTE — ED Notes (Signed)
Report to Bree, RN 300 

## 2017-03-27 NOTE — H&P (Signed)
History and Physical    Nathaniel AntonioDonald J Schreiner ZOX:[id number] DOB: 12/20/1980 DOA: 03/26/2017  PCP: Feliz Beamhelminski, Paul, MD   Patient coming from: Home.  I have personally briefly reviewed patient[facility]  Chief Complaint: Dizziness and chest pain.  HPI: Nathaniel Velasquez is a 36 y.o. male with medical history significant of hypertension, type 2 diabetes, prediabetic coma, morbid obesity who is coming to the emergency department with multiple complaints complaints, particularly complaints of dizziness and chest pain.  Per patient, he was driving when he became dizzy all of a sudden.  He had to stop his vehicle because he felt that he might faint.  He had trouble concentrating and had to call EMS who brought him to the emergency department.  After coming to the ED, the patient also mentioned he was having pressure-like chest pain associated with dyspnea.  He denies palpitations, diaphoresis, PND orthopnea.  Complaining also of abdominal pain, but no nausea, diarrhea, melena or hematochezia.  He denies dysuria, frequency or hematuria.  It was difficult to obtain history from the patient as he fixated on the fact that he does not use cannabis and wanted a repeat urine drug screen.  If he is not a user, he may have being exposed to cannabis from friends, but he did not elaborate.  He states that his blood glucose has been under control.  ED Course: His urinalysis was normal.  UDS was positive for THC.  CBC showed WBC of 8.5, hemoglobin 10.9 g/dL and platelets [id number] with a normal differential.  PT/INR/PTT and d-dimer were within normal limits.  CMP showed a glucose of 189 mg/dL and albumin of 3.4 g/dL, but all other values were normal.  EtOH level was 2 mg/dL.  BNP was 8.0 pg/mL and troponin less than 0.03 ng/mL.  His EKG was sinus rhythm with right axis deviation and borderline T abnormalities in inferior leads.  Review of Systems: As per HPI otherwise 10 point review of  systems negative.    Past Medical History:  Diagnosis Date  . Hypertension   . Prediabetic coma [facility](HCC)     Past Surgical History:  Procedure Laterality Date  . TONSILLECTOMY       reports that  has never smoked. he has never used smokeless tobacco. He reports that he does not drink alcohol or use drugs.  No Known Allergies  Family History  Problem Relation Age of Onset  . Diabetes Mother   . Heart failure Mother     Prior to Admission medications   Medication Sig Start Date End Date Taking? Authorizing Provider  carvedilol (COREG) 6.25 MG tablet Take 12.5 mg by mouth 2 (two) times daily with a meal.     [provider]  chlorthalidone (HYGROTON) 25 MG tablet Take 25 mg by mouth at bedtime.    [provider]  ipratropium (ATROVENT) 0.06 % nasal spray Place 2 sprays into both nostrils 4 (four) times daily. 09/16/16   Ivery QualeBryant, Hobson, PA-C  lisinopril (PRINIVIL,ZESTRIL) 30 MG tablet Take 30 mg by mouth daily. 08/07/16   [provider]  metFORMIN (GLUCOPHAGE) 500 MG tablet Take 2 tablets by mouth 2 (two) times daily. 12/06/14   [provider]  oxyCODONE-acetaminophen (PERCOCET) 7.5-325 MG tablet Take 1 tablet by mouth every 6 (six) hours as needed for moderate pain or severe pain (Must last 30 days.Do not take and drive a car or operate machinery). 03/17/17   Hilda LiasKeeling,  Deniece PortelaWayne, MD  sitaGLIPtin (JANUVIA) 25 MG tablet Take 25 mg by mouth daily. 03/11/16 03/11/17  [provider]  traMADol (ULTRAM) 50 MG tablet Take 1 tablet (50 mg total) by mouth every 6 (six) hours as needed. Patient not taking: Reported on 09/15/2016 07/07/16   Bethann BerkshireZammit, Joseph, MD    Physical Exam: Vitals:   03/27/17 0400 03/27/17 0430 03/27/17 0530 03/27/17 0600  BP: (!) 145/62 (!) 151/95 (!) 120/100 107/79  Pulse: 78 69 71 71  Resp:      Temp:      TempSrc:      SpO2: 100% 100% 97% 98%  Weight:      Height:        Constitutional: NAD, calm, comfortable Eyes: PERRL,  lids and conjunctivae normal ENMT: Mucous membranes are moist. Posterior pharynx clear of any exudate or lesions. Neck: normal, supple, no masses, no thyromegaly Respiratory: Decreased breath sounds on bases, otherwise clear to auscultation bilaterally, no wheezing, no crackles. Normal respiratory effort. No accessory muscle use.  Cardiovascular: Regular rate and rhythm, no murmurs / rubs / gallops. No extremity edema. 2+ pedal pulses. No carotid bruits.  Abdomen: Morbidly obese, soft, no tenderness, no masses palpated. No hepatosplenomegaly. Bowel sounds positive.  Musculoskeletal: no clubbing / cyanosis. Good ROM, no contractures. Normal muscle tone.  Skin: no rashes, lesions, ulcers on very limited dermatological exam..  Neurologic: CN 2-12 grossly intact. Sensation intact, DTR normal. Strength 5/5 in all 4.  Unable to evaluate gait. Psychiatric: Alert and oriented x 4.  Mildly anxious mood.    Labs on Admission: I have personally reviewed following labs and imaging studies  CBC: Recent Labs  Lab 03/27/17 0019  WBC 8.5  NEUTROABS 5.4  HGB 10.9*  HCT 35.3*  MCV 80.0  PLT 377   Basic Metabolic Panel: Recent Labs  Lab 03/27/17 0019  NA 138  K 4.1  CL 101  CO2 26  GLUCOSE 189*  BUN 17  CREATININE 0.88  CALCIUM 9.2   GFR: Estimated Creatinine Clearance: 260 mL/min (by C-G formula based on SCr of 0.88 mg/dL). Liver Function Tests: Recent Labs  Lab 03/27/17 0019  AST 17  ALT 24  ALKPHOS 52  BILITOT 0.3  PROT 7.8  ALBUMIN 3.4*   No results for input(s): LIPASE, AMYLASE in the last 168 hours. No results for input(s): AMMONIA in the last 168 hours. Coagulation Profile: Recent Labs  Lab 03/27/17 0205  INR 1.04   Cardiac Enzymes: Recent Labs  Lab 03/27/17 0019  TROPONINI <0.03   BNP (last 3 results) No results for input(s): PROBNP in the last 8760 hours. HbA1C: No results for input(s): HGBA1C in the last 72 hours. CBG: No results for input(s): GLUCAP in  the last 168 hours. Lipid Profile: No results for input(s): CHOL, HDL, LDLCALC, TRIG, CHOLHDL, LDLDIRECT in the last 72 hours. Thyroid Function Tests: No results for input(s): TSH, T4TOTAL, FREET4, T3FREE, THYROIDAB in the last 72 hours. Anemia Panel: No results for input(s): VITAMINB12, FOLATE, FERRITIN, TIBC, IRON, RETICCTPCT in the last 72 hours. Urine analysis:    Component Value Date/Time   COLORURINE YELLOW 03/27/2017 0220   APPEARANCEUR CLEAR 03/27/2017 0220   LABSPEC 1.023 03/27/2017 0220   PHURINE 5.0 03/27/2017 0220   GLUCOSEU NEGATIVE 03/27/2017 0220   HGBUR NEGATIVE 03/27/2017 0220   BILIRUBINUR NEGATIVE 03/27/2017 0220   KETONESUR NEGATIVE 03/27/2017 0220   PROTEINUR NEGATIVE 03/27/2017 0220   UROBILINOGEN 0.2 11/29/2011 1251   NITRITE NEGATIVE 03/27/2017 0220   LEUKOCYTESUR NEGATIVE  03/27/2017 0220    Radiological Exams on Admission: Dg Chest 2 View  Result Date: 03/27/2017 CLINICAL DATA:  36 year old male with shortness of breath. EXAM: CHEST  2 VIEW COMPARISON:  Chest radiograph dated 09/15/2016 FINDINGS: The heart size and mediastinal contours are within normal limits. Both lungs are clear. The visualized skeletal structures are unremarkable. IMPRESSION: No active cardiopulmonary disease. Electronically Signed   By: Elgie Collard M.D.   On: 03/27/2017 02:25    EKG: Independently reviewed Vent. rate 93 BPM PR interval * ms QRS duration 95 ms QT/QTc 357/444 ms P-R-T axes 36 113 -7 Sinus rhythm Right axis deviation Low voltage, precordial leads Borderline T abnormalities, inferior leads  Assessment/Plan Principal Problem:   Chest pain Telemetry/observation. Trend troponin levels. Continue beta-blocker. Start aspirin 325 mg p.o. daily Check repeat EKG. Check echocardiogram.  Active Problems:   Dizziness Per neurology, this could be related to a pontine ischemia Unable to fit in the MRI or CAT scanner.  Will check carotid Doppler and echo. Check  hemoglobin A1c and fasting lipid profile.    Essential hypertension Continue lisinopril 30 mg po daily. Continue carvedilol 12.5 mg p.o. twice daily. Continue chlorthalidone 25 mg p.o. at bedtime. Monitor blood pressure, BUN/creatinine and electrolytes.    GERD (gastroesophageal reflux disease) Protonix 40 mg po daily.    Anemia The patient has been chronically anemic for several years. Denies melena or hematochezia. Check anemia workup. Monitor hematocrit and hemoglobin.    Type 2 diabetes mellitus (HCC) Last hemoglobin A1c was 5.8% earlier this month. Carbohydrate modified diet. Continue metformin and Tradjenta. CBG monitoring with regular insulin sliding scale while in the hospital.   DVT prophylaxis: Lovenox SQ. Code Status: Full code. Family Communication:  Disposition Plan: Admit for partial TIA and chest pain work up. Consults called:  Admission status: Observation/Telemetry.    Bobette Mo MD [facility] Pager [phone].  If 7PM-7AM, please contact night-coverage [email or web address] Password [facility]  03/27/2017, 6:17 AM

## 2017-03-27 NOTE — ED Notes (Signed)
Attempted to walk pt around the desk with pulse ox, pt refused due to shortness of breath.

## 2017-03-28 DIAGNOSIS — K219 Gastro-esophageal reflux disease without esophagitis: Secondary | ICD-10-CM

## 2017-03-28 DIAGNOSIS — R072 Precordial pain: Secondary | ICD-10-CM | POA: Diagnosis not present

## 2017-03-28 DIAGNOSIS — R42 Dizziness and giddiness: Secondary | ICD-10-CM

## 2017-03-28 DIAGNOSIS — E118 Type 2 diabetes mellitus with unspecified complications: Secondary | ICD-10-CM

## 2017-03-28 DIAGNOSIS — R079 Chest pain, unspecified: Secondary | ICD-10-CM | POA: Diagnosis not present

## 2017-03-28 DIAGNOSIS — I10 Essential (primary) hypertension: Secondary | ICD-10-CM | POA: Diagnosis not present

## 2017-03-28 LAB — RETICULOCYTES
RBC.: 4.28 MIL/uL (ref 4.22–5.81)
Retic Count, Absolute: 98.4 10*3/uL (ref 19.0–186.0)
Retic Ct Pct: 2.3 % (ref 0.4–3.1)

## 2017-03-28 LAB — GLUCOSE, CAPILLARY
GLUCOSE-CAPILLARY: 116 mg/dL — AB (ref 65–99)
GLUCOSE-CAPILLARY: 106 mg/dL — AB (ref 65–99)

## 2017-03-28 LAB — LIPID PANEL
CHOL/HDL RATIO: 5.8 ratio
Cholesterol: 167 mg/dL (ref 0–200)
HDL: 29 mg/dL — AB (ref >40)
LDL CALC: 115 mg/dL — AB (ref 0–99)
TRIGLYCERIDES: 116 mg/dL (ref <150)
VLDL: 23 mg/dL (ref 0–40)

## 2017-03-28 MED ORDER — ATORVASTATIN CALCIUM 40 MG PO TABS: 40.0000 mg | ORAL_TABLET | Freq: Every day | ORAL | 0 refills | Status: AC

## 2017-03-28 MED ORDER — ASPIRIN EC 81 MG PO TBEC: 81.0000 mg | DELAYED_RELEASE_TABLET | Freq: Every day | ORAL | Status: AC

## 2017-03-28 NOTE — Evaluation (Signed)
Physical Therapy Evaluation Patient Details Name: Nathaniel Velasquez MRN: [id number] DOB: 08/25/1980 Today's Date: 03/28/2017   History of Present Illness  Nathaniel AntonioDonald J Yarrow is a 36 y.o. male with medical history significant of hypertension, type 2 diabetes, prediabetic coma, morbid obesity who is coming to the emergency department with multiple complaints complaints, particularly complaints of dizziness and chest pain. Per patient, he was driving when he became dizzy all of a sudden.  He had to stop his vehicle because he felt that he might faint.  He had trouble concentrating and had to call EMS who brought him to the emergency department.  After coming to the ED, the patient also mentioned he was having pressure-like chest pain associated with dyspnea.  He denies palpitations, diaphoresis, PND orthopnea.  Complaining also of abdominal pain, but no nausea, diarrhea, melena or hematochezia.  He denies dysuria, frequency or hematuria.  It was difficult to obtain history from the patient as he fixated on the fact that he does not use cannabis and wanted a repeat urine drug screen.  If he is not a user, he may have being exposed to cannabis from friends, but he did not elaborate.  He states that his blood glucose has been under control.    Clinical Impression  Nathaniel AntonioDonald J Choy is a 36 y.o. presenting for PT evaluation secondary to above diagnosis and admission. He is currently functioning at his baseline of independent with no device to mobilize. He is modified independent for bed mobility and for transfers as he requires extra time. Based on objective measures his strength is 5/5 for all extremities and he is ambulating at his self reported PLOF. Mr. Roney MarionFoust is safe to discharge from PT services and when medically ready I anticipate he can discharge home safely with no follow up PT needs. Acute PT will discharge to nursing staff to ambulate daily throughout length of stay.     Follow Up Recommendations No PT follow  up    Equipment Recommendations  None recommended by PT    Recommendations for Other Services       Precautions / Restrictions Precautions Precautions: None Restrictions Weight Bearing Restrictions: No      Mobility  Bed Mobility Overal bed mobility: Independent   Transfers Overall transfer level: Independent      Ambulation/Gait Ambulation/Gait assistance: Independent     Gait Pattern/deviations: Wide base of support   Gait velocity interpretation: at or above normal speed for age/gender General Gait Details: patient with wide BOS during gait secondary to stature      Balance Overall balance assessment: Independent(patient maintains balance 30 seconds wtih eyes closed and perturbations)          Pertinent Vitals/Pain Pain Assessment: No/denies pain Faces Pain Scale: No hurt    Home Living Family/patient expects to be discharged to:: Private residence Living Arrangements: Other relatives;Parent Available Help at Discharge: Family Type of Home: House Home Access: Stairs to enter   [employer] CorporationEntrance Stairs-Number of Steps: 2(step up onto porch and into house) Home Layout: One level Home Equipment: None      Prior Function Level of Independence: Independent    Comments: Patient works part time as a Mining engineerlong haul truck driver        Extremity/Trunk Assessment   Upper Extremity Assessment Upper Extremity Assessment: Overall WFL for tasks assessed(5/5 throughout)    Lower Extremity Assessment Lower Extremity Assessment: Overall WFL for tasks assessed(5/5 throughout)    Cervical / Trunk Assessment Cervical / Trunk Assessment: Normal  Communication   Communication: No difficulties  Cognition Arousal/Alertness: Awake/alert Behavior During Therapy: WFL for tasks assessed/performed Overall Cognitive Status: Within Functional Limits for tasks assessed            Assessment/Plan    PT Assessment Patent does not need any further PT services         PT  Goals (Current goals can be found in the Care Plan section)  Acute Rehab PT Goals Patient Stated Goal: return home for holidays PT Goal Formulation: With patient Time For Goal Achievement: 03/29/17 Potential to Achieve Goals: Good     AM-PAC PT "6 Clicks" Daily Activity  Outcome Measure Difficulty turning over in bed (including adjusting bedclothes, sheets and blankets)?: None Difficulty moving from lying on back to sitting on the side of the bed? : None Difficulty sitting down on and standing up from a chair with arms (e.g., wheelchair, bedside commode, etc,.)?: None Help needed moving to and from a bed to chair (including a wheelchair)?: None Help needed walking in hospital room?: None Help needed climbing 3-5 steps with a railing? : A Little 6 Click Score: 23    End of Session   Activity Tolerance: Patient tolerated treatment well Patient left: in bed Nurse Communication: Mobility status PT Visit Diagnosis: Unsteadiness on feet (R26.81);Difficulty in walking, not elsewhere classified (R26.2);Other abnormalities of gait and mobility (R26.89)    Time: 1005-1027 PT Time Calculation (min) (ACUTE ONLY): 22 min   Charges:   PT Evaluation $PT Eval Low Complexity: 1 Low     PT G Codes:   PT G-Codes **NOT FOR INPATIENT CLASS** Functional Assessment Tool Used: AM-PAC 6 Clicks Basic Mobility Functional Limitation: Mobility: Walking and moving around Mobility: Walking and Moving Around Current Status ([id number](G8978): At least 1 percent but less than 20 percent impaired, limited or restricted Mobility: Walking and Moving Around Goal Status [phone](G8979): At least 1 percent but less than 20 percent impaired, limited or restricted Mobility: Walking and Moving Around Discharge Status [phone](G8980): At least 1 percent but less than 20 percent impaired, limited or restricted    Valentino Saxonachel Quinn-Brown, PT, DPT Physical Therapist with [facility]  03/28/2017 11:54 AM

## 2017-03-28 NOTE — Progress Notes (Signed)
SLP Cancellation Note  Patient Details Name: Tyrell AntonioDonald J Buelow MRN: [id number] DOB: 10/11/1980   Cancelled treatment:       Reason Eval/Treat Not Completed: SLP screened, no needs identified, will sign off  Thank you,  Havery MorosDabney Sinclair [place], CCC-SLP [phone]  Deedee Lybarger 03/28/2017, 11:36 AM

## 2017-03-28 NOTE — Discharge Instructions (Signed)
Follow with Primary MD  Nathaniel Velasquez, Paul, MD  and other consultant's as instructed your Hospitalist MD  Please get a complete blood count and chemistry panel checked by your Primary MD at your next visit, and again as instructed by your Primary MD.  Get Medicines reviewed and adjusted: Please take all your medications with you for your next visit with your Primary MD  Laboratory/radiological data: Please request your Primary MD to go over all hospital tests and procedure/radiological results at the follow up, please ask your Primary MD to get all Hospital records sent to his/her office.  In some cases, they will be blood work, cultures and biopsy results pending at the time of your discharge. Please request that your primary care M.D. follows up on these results.  Also Note the following: If you experience worsening of your admission symptoms, develop shortness of breath, life threatening emergency, suicidal or homicidal thoughts you must seek medical attention immediately by calling 911 or calling your MD immediately  if symptoms less severe.  You must read complete instructions/literature along with all the possible adverse reactions/side effects for all the Medicines you take and that have been prescribed to you. Take any new Medicines after you have completely understood and accpet all the possible adverse reactions/side effects.   Do not drive when taking Pain medications or sleeping medications (Benzodaizepines)  Do not take more than prescribed Pain, Sleep and Anxiety Medications. It is not advisable to combine anxiety,sleep and pain medications without talking with your primary care practitioner  Special Instructions: If you have smoked or chewed Tobacco  in the last 2 yrs please stop smoking, stop any regular Alcohol  and or any Recreational drug use.  Wear Seat belts while driving.  Please note: You were cared for by a hospitalist during your hospital stay. Once you are  discharged, your primary care physician will handle any further medical issues. Please note that NO REFILLS for any discharge medications will be authorized once you are discharged, as it is imperative that you return to your primary care physician (or establish a relationship with a primary care physician if you do not have one) for your post hospital discharge needs so that they can reassess your need for medications and monitor your lab values.      Bariatric Surgery Information Bariatric surgery, also called weight loss surgery, is a procedure that helps you lose weight. You may consider or your health care provider may suggest bariatric surgery if:  You are severely obese and have been unable to lose weight through diet and exercise.  You have health problems related to obesity, such as: ? Type 2 diabetes. ? Heart disease. ? Lung disease.  How does bariatric surgery help me lose weight? Bariatric surgery helps you lose weight by decreasing how much food your body absorbs. This is done by closing off part of your stomach to make it smaller. This restricts the amount of food your stomach can hold. Bariatric surgery can also change your bodys regular digestive process, so that food bypasses the parts of your body that absorb calories and nutrients. If you decide to have bariatric surgery, it is important to continue to eat a healthy diet and exercise regularly after the surgery. What are the different kinds of bariatric surgery? There are two kinds of bariatric surgeries:  Restrictive surgeries make your stomach smaller. They do not change your digestive process. The smaller the size of your new stomach, the less food you can eat. There are  different types of restrictive surgeries.  Malabsorptive surgeries both make your stomach smaller and alter your digestive process so that your body processes less calories and nutrients. These are the most common kind of bariatric surgery. There are  different types of malabsorptive surgeries.  What are the different types of restrictive surgery? Adjustable Gastric Banding In this procedure, an inflatable band is placed around your stomach near the upper end. This makes the passageway for food into the rest of your stomach much smaller. The band can be adjusted, making it tighter or looser, by filling it with salt solution. Your surgeon can adjust the band based on how are you feeling and how much weight you are losing. The band can be removed in the future. Vertical Banded Gastroplasty In this procedure, staples are used to separate your stomach into two parts, a small upper pouch and a bigger lower pouch. This decreases how much food you can eat. Sleeve Gastrectomy In this procedure, your stomach is made smaller. This is done by surgically removing a large part of your stomach. When your stomach is smaller, you feel full more quickly and reduce how much you eat. What are the different types of malabsorptive surgery? Roux-en-Y Gastric Bypass (RGB) This is the most common weight loss surgery. In this procedure, a small stomach pouch is created in the upper part of your stomach. Next, this small stomach pouch is attached directly to the middle part of your small intestine. The farther down your small intestine the new connection is made, the fewer calories and nutrients you will absorb. Biliopancreatic Diversion with Duodenal Switch (BPD/DS) This is a multi-step procedure. In this procedure, a large part of your stomach is removed, making your stomach smaller. Next, this smaller stomach is attached to the lower part of your small intestine. Like the RGB surgery, you absorb fewer calories and nutrients the farther down your small intestine the attachment is made. What are the risks of bariatric surgery? As with any surgical procedure, each type of bariatric surgery has its own risks. These risks also depend on your age, your overall health, and any  other medical conditions you may have. When deciding on bariatric surgery, it is very important to:  Talk to your health care provider and choose the surgery that is best for you.  Ask your health care provider about specific risks for the surgery you choose.  Where to find more information:  American Society for Metabolic & Bariatric Surgery: www.asmbs.org  Weight-control Information Network (WIN): win.StageSync.siniddk.nih.gov This information is not intended to replace advice given to you by your health care provider. Make sure you discuss any questions you have with your health care provider. Document Released: 03/23/2005 Document Revised: 08/29/2015 Document Reviewed: 09/21/2012 Elsevier Interactive Patient Education  2017 [employer]

## 2017-03-28 NOTE — Discharge Summary (Signed)
Physician Discharge Summary  [name] OAC:[id number] DOB: [birth year] DOA: 03/26/2017  PCP: Feliz Beam, MD  Admit date: 03/26/2017 Discharge date: 03/28/2017  Admitted From: Home  Disposition: Home  Recommendations for Outpatient Follow-up:  1. Follow up with PCP in 1-2 weeks 2. Follow up with neurologist in 2 weeks 3. Please obtain BMP/CBC in one week 4. Please follow up on the following pending results: final culture data  Discharge Condition: STABLE   CODE STATUS: FULL    Brief Hospitalization Summary: Please see all hospital notes, images, labs for full details of the hospitalization. HPI: Nathaniel Velasquez is a 36 y.o. male with medical history significant of hypertension, type 2 diabetes, prediabetic coma, morbid obesity who is coming to the emergency department with multiple complaints complaints, particularly complaints of dizziness and chest pain.  Per patient, he was driving when he became dizzy all of a sudden.  He had to stop his vehicle because he felt that he might faint.  He had trouble concentrating and had to call EMS who brought him to the emergency department.  After coming to the ED, the patient also mentioned he was having pressure-like chest pain associated with dyspnea.  He denies palpitations, diaphoresis, PND orthopnea.  Complaining also of abdominal pain, but no nausea, diarrhea, melena or hematochezia.  He denies dysuria, frequency or hematuria.  It was difficult to obtain history from the patient as he fixated on the fact that he does not use cannabis and wanted a repeat urine drug screen.  If he is not a user, he may have being exposed to cannabis from friends, but he did not elaborate.  He states that his blood glucose has been under control.  ED Course: His urinalysis was normal.  UDS was positive for THC.  CBC showed WBC of 8.5, hemoglobin 10.9 g/dL and platelets 010 with a normal differential.  PT/INR/PTT and d-dimer were within normal limits.  CMP  showed a glucose of 189 mg/dL and albumin of 3.4 g/dL, but all other values were normal.  EtOH level was 2 mg/dL.  BNP was 8.0 pg/mL and troponin less than 0.03 ng/mL.  His EKG was sinus rhythm with right axis deviation and borderline T abnormalities in inferior leads.  Chest pain Telemetry/observation. Ruled out for ACS by serial troponin levels. Continue beta-blocker. Echocardiogram: Study Conclusions   - Left ventricle: The cavity size was normal. Wall thickness was  increased in a pattern of mild LVH. Systolic function was normal.   The estimated ejection fraction was in the range of 55% to 60%.   Wall motion was normal; there were no regional wall motion abnormalities. Left ventricular diastolic function parameters   were normal. - Mitral valve: Calcified annulus.  Impressions:  - Technically diffcult; definity used; normal LV systolic and  diastolic function; mild LVH; SOE cannot be excluded with this   study; suggest TEE to further assess if clinically indicated.    Dizziness Unable to fit in the MRI or CT scanner.  Suspect symptoms related to dehydration and THC (positive in UDS x2).  Outpatient neurology follow up recommended.  Aspirin 81 mg daily recommended.   Carotid Dopplers: IMPRESSION: 1. Technically challenging and suboptimal examination secondary to patient body habitus. 2. Grossly normal bilateral carotid duplex ultrasound. No evidence of significant plaque or stenosis.    Essential hypertension Continue lisinopril 30 mg po daily. Continue carvedilol 12.5 mg p.o. twice daily. Continue chlorthalidone 25 mg p.o. at bedtime. Monitor blood pressure, BUN/creatinine and electrolytes.  GERD (gastroesophageal reflux disease) Protonix 40 mg po daily.     Anemia The patient has been chronically anemic for several years. Denies melena or hematochezia. Check anemia workup. Monitor hematocrit and hemoglobin.  Hyperlipidemia LDL>100 Started atorvastatin  40 mg daily    Type 2 diabetes mellitus (HCC) Last hemoglobin A1c was 5.8% earlier this month. Carbohydrate modified diet. Continue metformin and Tradjenta. CBG monitoring with regular insulin sliding scale while in the hospital.  DVT prophylaxis: Lovenox SQ. Code Status: Full code. Family Communication:  Disposition Plan: Admit for partial TIA and chest pain work up. Marland Kitchen. Discharge Diagnoses:  Principal Problem:   Chest pain Active Problems:   Essential hypertension   GERD (gastroesophageal reflux disease)   Anemia   Dizziness   Type 2 diabetes mellitus (HCC)    Discharge Instructions: Discharge Instructions    Call MD for:  difficulty breathing, headache or visual disturbances   Complete by:  As directed    Call MD for:  extreme fatigue   Complete by:  As directed    Call MD for:  hives   Complete by:  As directed    Call MD for:  persistant dizziness or light-headedness   Complete by:  As directed    Call MD for:  persistant nausea and vomiting   Complete by:  As directed    Call MD for:  severe uncontrolled pain   Complete by:  As directed    Diet - low sodium heart healthy   Complete by:  As directed    Increase activity slowly   Complete by:  As directed      Allergies as of 03/28/2017   No Known Allergies     Medication List    TAKE these medications   aspirin EC 81 MG tablet Take 1 tablet (81 mg total) by mouth daily. What changed:    when to take this  reasons to take this   atorvastatin 40 MG tablet Commonly known as:  LIPITOR Take 1 tablet (40 mg total) by mouth daily.   carvedilol 6.25 MG tablet Commonly known as:  COREG Take 12.5 mg by mouth 2 (two) times daily with a meal.   chlorthalidone 25 MG tablet Commonly known as:  HYGROTON Take 25 mg by mouth at bedtime.   esomeprazole 40 MG capsule Commonly known as:  NEXIUM Take 40 mg by mouth 2 (two) times daily.   fluticasone 50 MCG/ACT nasal spray Commonly known as:  FLONASE Place  1 spray into both nostrils daily.   lisinopril 30 MG tablet Commonly known as:  PRINIVIL,ZESTRIL Take 30 mg by mouth daily.   metFORMIN 500 MG tablet Commonly known as:  GLUCOPHAGE Take 2 tablets by mouth 2 (two) times daily.   oxyCODONE-acetaminophen 7.5-325 MG tablet Commonly known as:  PERCOCET Take 1 tablet by mouth every 6 (six) hours as needed for moderate pain or severe pain (Must last 30 days.Do not take and drive a car or operate machinery).   PROAIR HFA 108 (90 Base) MCG/ACT inhaler Generic drug:  albuterol Inhale 2 puffs into the lungs every 4 (four) hours as needed.   sitaGLIPtin 25 MG tablet Commonly known as:  JANUVIA Take 25 mg by mouth daily.      Follow-up Information    Chelminski, Renae FicklePaul, MD. Schedule an appointment as soon as possible for a visit in 1 week(s).   Specialty:  Internal Medicine Why:  Hospital Follow Up Contact information: [address] MEDICINE/GENERAL, [address][id number] [facility] [place]  [place] [address] [phone]        Beryle Beams, MD. Schedule an appointment as soon as possible for a visit in 2 week(s).   Specialty:  Neurology Why:  Establish Care for TIA/Dizziness Spells Contact information: [address] [place] [facility] [address] [phone]          No Known Allergies Allergies as of 03/28/2017   No Known Allergies     Medication List    TAKE these medications   aspirin EC 81 MG tablet Take 1 tablet (81 mg total) by mouth daily. What changed:    when to take this  reasons to take this   atorvastatin 40 MG tablet Commonly known as:  LIPITOR Take 1 tablet (40 mg total) by mouth daily.   carvedilol 6.25 MG tablet Commonly known as:  COREG Take 12.5 mg by mouth 2 (two) times daily with a meal.   chlorthalidone 25 MG tablet Commonly known as:  HYGROTON Take 25 mg by mouth at bedtime.   esomeprazole 40 MG capsule Commonly known as:  NEXIUM Take 40 mg by mouth 2 (two) times daily.    fluticasone 50 MCG/ACT nasal spray Commonly known as:  FLONASE Place 1 spray into both nostrils daily.   lisinopril 30 MG tablet Commonly known as:  PRINIVIL,ZESTRIL Take 30 mg by mouth daily.   metFORMIN 500 MG tablet Commonly known as:  GLUCOPHAGE Take 2 tablets by mouth 2 (two) times daily.   oxyCODONE-acetaminophen 7.5-325 MG tablet Commonly known as:  PERCOCET Take 1 tablet by mouth every 6 (six) hours as needed for moderate pain or severe pain (Must last 30 days.Do not take and drive a car or operate machinery).   PROAIR HFA 108 (90 Base) MCG/ACT inhaler Generic drug:  albuterol Inhale 2 puffs into the lungs every 4 (four) hours as needed.   sitaGLIPtin 25 MG tablet Commonly known as:  JANUVIA Take 25 mg by mouth daily.       Procedures/Studies: Dg Chest 2 View  Result Date: 03/27/2017 CLINICAL DATA:  36 year old male with shortness of breath. EXAM: CHEST  2 VIEW COMPARISON:  Chest radiograph dated 09/15/2016 FINDINGS: The heart size and mediastinal contours are within normal limits. Both lungs are clear. The visualized skeletal structures are unremarkable. IMPRESSION: No active cardiopulmonary disease. Electronically Signed   By: Elgie Collard M.D.   On: 03/27/2017 02:25   US Carotid Bilateral (at [facility] Only)  Result Date: 03/27/2017 CLINICAL DATA:  36 year old male with dizziness and syncope EXAM: BILATERAL CAROTID DUPLEX ULTRASOUND TECHNIQUE: Wallace Cullens scale imaging, color Doppler and duplex ultrasound were performed of bilateral carotid and vertebral arteries in the neck. COMPARISON:  None. FINDINGS: Criteria: Quantification of carotid stenosis is based on velocity parameters that correlate the residual internal carotid diameter with NASCET-based stenosis levels, using the diameter of the distal internal carotid lumen as the denominator for stenosis measurement. The following velocity measurements were obtained: RIGHT ICA:  63/21 cm/sec CCA:  102/18 cm/sec  SYSTOLIC ICA/CCA RATIO:  0.6 DIASTOLIC ICA/CCA RATIO:  1.2 ECA:  55 cm/sec LEFT ICA:  69/17 cm/sec CCA:  121/20 cm/sec SYSTOLIC ICA/CCA RATIO:  0.6 DIASTOLIC ICA/CCA RATIO:  0.9 ECA:  45 cm/sec RIGHT CAROTID ARTERY: No significant atherosclerotic plaque or evidence of stenosis. RIGHT VERTEBRAL ARTERY:  Patent with antegrade flow. LEFT CAROTID ARTERY: No significant atherosclerotic plaque or evidence of stenosis. LEFT VERTEBRAL ARTERY:  Patent with antegrade flow. IMPRESSION: 1. Technically challenging and suboptimal examination secondary to patient body habitus. 2.  Grossly normal bilateral carotid duplex ultrasound. No evidence of significant plaque or stenosis. Signed, Sterling BigHeath K. McCullough, MD [facility] Electronically Signed   By: Malachy MoanHeath  McCullough M.D.   On: 03/27/2017 13:47   [place] Abdomen Limited  Result Date: 03/27/2017 CLINICAL DATA:  Right upper quadrant abdominal pain. EXAM: ULTRASOUND ABDOMEN LIMITED RIGHT UPPER QUADRANT Study is markedly limited by the patient's body habitus. COMPARISON:  None. FINDINGS: Gallbladder: Not visualized. No significant tenderness to transducer pressure in the right upper quadrant. Common bile duct: Not visualized. Liver: Minimally visualized. There is significant attenuation of the sound beam. There is likely fatty infiltration of the liver, but this is inconclusive. IMPRESSION: 1. Exam markedly limited and essentially nondiagnostic for evaluation of the right upper quadrant. Gallbladder and common bile duct not visualized. Liver is inadequately visualized for assessment, but there is likely hepatic steatosis. Electronically Signed   By: Amie Portlandavid  Ormond M.D.   On: 03/27/2017 13:33     Subjective: Pt says that he feels much better now back to his baseline.  He still denies ever using THC.   Discharge Exam: Vitals:   03/28/17 0350 03/28/17 0750  BP: (!) 103/48 122/62  Pulse: 67 71  Resp: 18 18  Temp: 98 F  (36.7 C) 98.4 F (36.9 C)  SpO2: 98% 100%   Vitals:   03/27/17 1950 03/27/17 2350 03/28/17 0350 03/28/17 0750  BP: 121/69 (!) 116/39 (!) 103/48 122/62  Pulse: 72 69 67 71  Resp: 18 18 18 18   Temp: 98.4 F (36.9 C) 97.9 F (36.6 C) 98 F (36.7 C) 98.4 F (36.9 C)  TempSrc: Oral Oral Oral Oral  SpO2: 96% 98% 98% 100%  Weight:      Height:       General: Morbidly obese male.  Pt is alert, awake, not in acute distress Cardiovascular: distant sounds RRR, S1/S2 +, no rubs, no gallops Respiratory: CTA bilaterally, no wheezing, no rhonchi Abdominal: Soft, NT, ND, bowel sounds + Extremities:  no cyanosis Neurological: no focal deficits found   The results of significant diagnostics from this hospitalization (including imaging, microbiology, ancillary and laboratory) are listed below for reference.     Microbiology: No results found for this or any previous visit (from the past 240 hour(s)).   Labs: BNP (last 3 results) Recent Labs    03/27/17 0019  BNP 8.0   Basic Metabolic Panel: Recent Labs  Lab 03/27/17 0019  NA 138  K 4.1  CL 101  CO2 26  GLUCOSE 189*  BUN 17  CREATININE 0.88  CALCIUM 9.2   Liver Function Tests: Recent Labs  Lab 03/27/17 0019  AST 17  ALT 24  ALKPHOS 52  BILITOT 0.3  PROT 7.8  ALBUMIN 3.4*   Recent Labs  Lab 03/27/17 0541  LIPASE 18   No results for input(s): AMMONIA in the last 168 hours. CBC: Recent Labs  Lab 03/27/17 0019  WBC 8.5  NEUTROABS 5.4  HGB 10.9*  HCT 35.3*  MCV 80.0  PLT 377   Cardiac Enzymes: Recent Labs  Lab 03/27/17 0019 03/27/17 0541 03/27/17 1337  TROPONINI <0.03 <0.03 <0.03   BNP: Invalid input(s): POCBNP CBG: Recent Labs  Lab 03/27/17 0846 03/27/17 1143 03/27/17 1644 03/27/17 2112 03/28/17 0748  GLUCAP 108* 117* 100* 145* 116*   D-Dimer Recent Labs    03/27/17 0205  DDIMER 0.47   Hgb A1c No results for input(s): HGBA1C in the last 72 hours. Lipid Profile Recent Labs  03/28/17 0632  CHOL 167  HDL 29*  LDLCALC 115*  TRIG 116  CHOLHDL 5.8   Thyroid function studies No results for input(s): TSH, T4TOTAL, T3FREE, THYROIDAB in the last 72 hours.  Invalid input(s): FREET3 Anemia work up Recent Labs    03/28/17 0632  RETICCTPCT 2.3   Urinalysis    Component Value Date/Time   COLORURINE YELLOW 03/27/2017 0220   APPEARANCEUR CLEAR 03/27/2017 0220   LABSPEC 1.023 03/27/2017 0220   PHURINE 5.0 03/27/2017 0220   GLUCOSEU NEGATIVE 03/27/2017 0220   HGBUR NEGATIVE 03/27/2017 0220   BILIRUBINUR NEGATIVE 03/27/2017 0220   KETONESUR NEGATIVE 03/27/2017 0220   PROTEINUR NEGATIVE 03/27/2017 0220   UROBILINOGEN 0.2 11/29/2011 1251   NITRITE NEGATIVE 03/27/2017 0220   LEUKOCYTESUR NEGATIVE 03/27/2017 0220   Sepsis Labs Invalid input(s): PROCALCITONIN,  WBC,  LACTICIDVEN Microbiology No results found for this or any previous visit (from the past 240 hour(s)).  Time coordinating discharge:   SIGNED:  Standley Dakins, MD  [facility] 03/28/2017, 9:59 AM Pager [phone]  If 7PM-7AM, please contact night-coverage [email or web address] Password [email or web address]

## 2017-03-29 LAB — IRON AND TIBC
IRON: 42 ug/dL — AB (ref 45–182)
Saturation Ratios: 14 % — ABNORMAL LOW (ref 17.9–39.5)
TIBC: 300 ug/dL (ref 250–450)
UIBC: 258 ug/dL

## 2017-03-29 LAB — VITAMIN B12: VITAMIN B 12: 255 pg/mL (ref 180–914)

## 2017-03-29 LAB — HIV ANTIBODY (ROUTINE TESTING W REFLEX): HIV Screen 4th Generation wRfx: NONREACTIVE

## 2017-03-29 LAB — FERRITIN: FERRITIN: 63 ng/mL (ref 24–336)

## 2017-03-29 LAB — HEMOGLOBIN A1C
HEMOGLOBIN A1C: 6.8 % — AB (ref 4.8–5.6)
MEAN PLASMA GLUCOSE: 148.46 mg/dL

## 2017-03-29 LAB — FOLATE: FOLATE: 8 ng/mL (ref >5.9)

## 2017-04-15 ENCOUNTER — Telehealth: Payer: Self-pay | Admitting: Orthopaedic Surgery

## 2017-04-15 MED ORDER — OXYCODONE-ACETAMINOPHEN 7.5-325 MG PO TABS: 1.0000 | ORAL_TABLET | Freq: Four times a day (QID) | ORAL | 0 refills | Status: DC | PRN

## 2017-04-15 NOTE — Telephone Encounter (Signed)
Patient requests refill on Oxycodone/Acetaminophen 7.5-325  Ms.   Qty  130  Sig: Take 1 tablet by mouth every 6 (six) hours as needed for moderate pain or severe pain (Must last 30 days.Do not take and drive a car or operate machinery).  [facility]

## 2017-05-18 ENCOUNTER — Ambulatory Visit (INDEPENDENT_AMBULATORY_CARE_PROVIDER_SITE_OTHER): Payer: Medicare Other | Admitting: Orthopaedic Surgery

## 2017-05-18 ENCOUNTER — Encounter: Payer: Self-pay | Admitting: Orthopaedic Surgery

## 2017-05-18 VITALS — BP 141/69 | HR 76 | Temp 98.1°F | Ht 72.0 in | Wt >= 6400 oz

## 2017-05-18 DIAGNOSIS — M545 Low back pain, unspecified: Secondary | ICD-10-CM

## 2017-05-18 DIAGNOSIS — G8929 Other chronic pain: Secondary | ICD-10-CM | POA: Diagnosis not present

## 2017-05-18 MED ORDER — OXYCODONE-ACETAMINOPHEN 7.5-325 MG PO TABS: 1.0000 | ORAL_TABLET | Freq: Four times a day (QID) | ORAL | 0 refills | Status: DC | PRN

## 2017-05-18 NOTE — Progress Notes (Signed)
Patient ZO:[id number] Nathaniel Velasquez, male DOB:04/24/1980, 37 y.o. VWU:[id number]  Chief Complaint  Patient presents with  . Back Pain    HPI  Nathaniel Velasquez is a 37 y.o. male who has chronic pain of the lower back, mid line with no paresthesias.  He has had more pain in the cold weather.  He has no weakness.  He is massively obese. HPI  Body mass index is 86.8 kg/m.  ROS  Review of Systems  HENT: Negative for congestion.   Respiratory: Negative for shortness of breath.   Cardiovascular: Negative for chest pain.  Endocrine: Positive for cold intolerance.  Musculoskeletal: Positive for arthralgias, back pain, gait problem and myalgias.  Allergic/Immunologic: Positive for environmental allergies.  All other systems reviewed and are negative.   Past Medical History:  Diagnosis Date  . Hypertension   . Prediabetic coma [facility])     Past Surgical History:  Procedure Laterality Date  . TONSILLECTOMY      Family History  Problem Relation Age of Onset  . Diabetes Mother   . Heart failure Mother     Social History Social History   Tobacco Use  . Smoking status: Never Smoker  . Smokeless tobacco: Never Used  Substance Use Topics  . Alcohol use: No  . Drug use: No    No Known Allergies  Current Outpatient Medications  Medication Sig Dispense Refill  . albuterol (PROAIR HFA) 108 (90 Base) MCG/ACT inhaler Inhale 2 puffs into the lungs every 4 (four) hours as needed.    Marland Kitchen aspirin EC 81 MG tablet Take 1 tablet (81 mg total) by mouth daily.    . carvedilol (COREG) 6.25 MG tablet Take 12.5 mg by mouth 2 (two) times daily with a meal.     . chlorthalidone (HYGROTON) 25 MG tablet Take 25 mg by mouth at bedtime.    . fluticasone (FLONASE) 50 MCG/ACT nasal spray Place 1 spray into both nostrils daily.  5  . lisinopril (PRINIVIL,ZESTRIL) 30 MG tablet Take 30 mg by mouth daily.  11  . metFORMIN (GLUCOPHAGE) 500 MG tablet Take 2 tablets by mouth 2 (two) times daily.  1  .  oxyCODONE-acetaminophen (PERCOCET) 7.5-325 MG tablet Take 1 tablet by mouth every 6 (six) hours as needed for moderate pain or severe pain (Must last 30 days). 130 tablet 0  . atorvastatin (LIPITOR) 40 MG tablet Take 1 tablet (40 mg total) by mouth daily. 30 tablet 0  . sitaGLIPtin (JANUVIA) 25 MG tablet Take 25 mg by mouth daily.     No current facility-administered medications for this visit.      Physical Exam  Blood pressure (!) 141/69, pulse 76, temperature 98.1 F (36.7 C), height 6' (1.829 m), weight (!) 640 lb (290.3 kg).  Constitutional: overall normal hygiene, normal nutrition, well developed, normal grooming, normal body habitus. Assistive device:none  Musculoskeletal: gait and station Limp waddling type gait, muscle tone and strength are normal, no tremors or atrophy is present.  .  Neurological: coordination overall normal.  Deep tendon reflex/nerve stretch intact.  Sensation normal.  Cranial nerves II-XII intact.   Skin:   Normal overall no scars, lesions, ulcers or rashes. No psoriasis.  Psychiatric: Alert and oriented x 3.  Recent memory intact, remote memory unclear.  Normal mood and affect. Well groomed.  Good eye contact.  Cardiovascular: overall no swelling, no varicosities, no edema bilaterally, normal temperatures of the legs and arms, no clubbing, cyanosis and good capillary refill.  Lymphatic: palpation is normal.  Spine/Pelvis examination:  Inspection:  Overall, sacoiliac joint benign and hips nontender; without crepitus or defects.   Thoracic spine inspection: Alignment normal without kyphosis present   Lumbar spine inspection:  Alignment  with normal lumbar lordosis, without scoliosis apparent.   Thoracic spine palpation:  without tenderness of spinal processes   Lumbar spine palpation: without tenderness of lumbar area; without tightness of lumbar muscles    Range of Motion:   Lumbar flexion, forward flexion is only 15 degrees without pain or  tenderness    Lumbar extension is full without pain or tenderness   Left lateral bend is normal without pain or tenderness   Right lateral bend is normal without pain or tenderness   Straight leg raising is normal  Strength & tone: normal   Stability overall normal stability All other systems reviewed and are negative   The patient has been educated about the nature of the problem(s) and counseled on treatment options.  The patient appeared to understand what I have discussed and is in agreement with it.  Encounter Diagnoses  Name Primary?  . 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 Morbid obesity due to excess calories [facility](HCC)     PLAN Call if any problems.  Precautions discussed.  Continue current medications.   Return to clinic 3 months   I have reviewed the [facility] [place] Cont[place] Substance Reporting System web site prior to prescribing narcotic medicine for this patient.  Electronically Signed Darreld McleanWayne Chastity Noland, MD 2/12/20198:22 AM

## 2017-06-16 ENCOUNTER — Other Ambulatory Visit: Payer: Self-pay | Admitting: Orthopaedic Surgery

## 2017-06-16 NOTE — Telephone Encounter (Signed)
Patient called - aware that while Dr Hilda LiasKeeling is out of clinic for extended time, Dr Romeo AppleHarrison is reviewing requests  - requests refill:  oxyCODONE-acetaminophen (PERCOCET) 7.5-325 MG tablet 130 tablet   - [facility] [place]

## 2017-06-17 MED ORDER — OXYCODONE-ACETAMINOPHEN 7.5-325 MG PO TABS: 1.0000 | ORAL_TABLET | Freq: Four times a day (QID) | ORAL | 0 refills | Status: DC | PRN

## 2017-07-17 DIAGNOSIS — F29 Unspecified psychosis not due to a substance or known physiological condition: Secondary | ICD-10-CM | POA: Insufficient documentation

## 2017-07-20 ENCOUNTER — Other Ambulatory Visit: Payer: Self-pay | Admitting: Orthopaedic Surgery

## 2017-07-20 MED ORDER — OXYCODONE-ACETAMINOPHEN 7.5-325 MG PO TABS: 1.0000 | ORAL_TABLET | Freq: Four times a day (QID) | ORAL | 0 refills | Status: DC | PRN

## 2017-07-20 NOTE — Telephone Encounter (Signed)
Oxycodone-Acetaminophen 7.5/325 mg  Qty 130 Tablets  Take 1 tablet by mouth every 6 (six) hours as needed for moderate pain or severe pain (Must last 30 days).  PATIENT USES [facility] ON [address]

## 2017-08-18 ENCOUNTER — Encounter: Payer: Self-pay | Admitting: Orthopaedic Surgery

## 2017-08-18 ENCOUNTER — Other Ambulatory Visit: Payer: Self-pay

## 2017-08-18 ENCOUNTER — Encounter (HOSPITAL_COMMUNITY): Payer: Self-pay | Admitting: Emergency Medicine

## 2017-08-18 ENCOUNTER — Emergency Department (HOSPITAL_COMMUNITY): Payer: Medicare Other

## 2017-08-18 ENCOUNTER — Emergency Department (HOSPITAL_COMMUNITY)
Admission: EM | Admit: 2017-08-18 | Discharge: 2017-08-18 | Disposition: A | Discharge status: Home / Self Care | Payer: Medicare Other | Attending: Emergency Medicine | Admitting: Emergency Medicine

## 2017-08-18 ENCOUNTER — Ambulatory Visit (INDEPENDENT_AMBULATORY_CARE_PROVIDER_SITE_OTHER): Payer: Medicare Other | Admitting: Orthopaedic Surgery

## 2017-08-18 VITALS — BP 151/94 | HR 89 | Temp 98.1°F | Ht 72.0 in

## 2017-08-18 DIAGNOSIS — S39012A Strain of muscle, fascia and tendon of lower back, initial encounter: Secondary | ICD-10-CM | POA: Diagnosis not present

## 2017-08-18 DIAGNOSIS — S161XXA Strain of muscle, fascia and tendon at neck level, initial encounter: Secondary | ICD-10-CM | POA: Diagnosis not present

## 2017-08-18 DIAGNOSIS — G8929 Other chronic pain: Secondary | ICD-10-CM | POA: Diagnosis not present

## 2017-08-18 DIAGNOSIS — S8002XA Contusion of left knee, initial encounter: Secondary | ICD-10-CM

## 2017-08-18 DIAGNOSIS — Y999 Unspecified external cause status: Secondary | ICD-10-CM | POA: Insufficient documentation

## 2017-08-18 DIAGNOSIS — Z7984 Long term (current) use of oral hypoglycemic drugs: Secondary | ICD-10-CM | POA: Diagnosis not present

## 2017-08-18 DIAGNOSIS — R52 Pain, unspecified: Secondary | ICD-10-CM

## 2017-08-18 DIAGNOSIS — Z7982 Long term (current) use of aspirin: Secondary | ICD-10-CM | POA: Diagnosis not present

## 2017-08-18 DIAGNOSIS — M545 Low back pain: Secondary | ICD-10-CM

## 2017-08-18 DIAGNOSIS — Y9389 Activity, other specified: Secondary | ICD-10-CM | POA: Insufficient documentation

## 2017-08-18 DIAGNOSIS — I10 Essential (primary) hypertension: Secondary | ICD-10-CM | POA: Diagnosis not present

## 2017-08-18 DIAGNOSIS — Z79899 Other long term (current) drug therapy: Secondary | ICD-10-CM | POA: Insufficient documentation

## 2017-08-18 DIAGNOSIS — S199XXA Unspecified injury of neck, initial encounter: Secondary | ICD-10-CM | POA: Diagnosis present

## 2017-08-18 DIAGNOSIS — E119 Type 2 diabetes mellitus without complications: Secondary | ICD-10-CM | POA: Insufficient documentation

## 2017-08-18 DIAGNOSIS — Y92410 Unspecified street and highway as the place of occurrence of the external cause: Secondary | ICD-10-CM | POA: Diagnosis not present

## 2017-08-18 LAB — CBG MONITORING, ED: Glucose-Capillary: 159 mg/dL — ABNORMAL HIGH (ref 65–99)

## 2017-08-18 MED ORDER — CYCLOBENZAPRINE HCL 10 MG PO TABS: 10.0000 mg | ORAL_TABLET | Freq: Three times a day (TID) | ORAL | 0 refills | Status: DC

## 2017-08-18 MED ORDER — OXYCODONE-ACETAMINOPHEN 7.5-325 MG PO TABS: 1.0000 | ORAL_TABLET | Freq: Four times a day (QID) | ORAL | 0 refills | Status: DC | PRN
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
## 2017-08-18 NOTE — Progress Notes (Signed)
Patient [name], male DOB:30-Aug-1980, 37 y.o. YNW:[id number]  Chief Complaint  Patient presents with  . Back Pain    LUMBAR    HPI  JHAYDEN Velasquez is a 37 y.o. male who has chronic lower back pain. He has had more pain over the last month than in a while.  He has no new trauma.  He is massively obese.  He has no weakness, no paresthesias. HPI  Body mass index is 86.8 kg/m.  ROS  Review of Systems  HENT: Negative for congestion.   Respiratory: Negative for shortness of breath.   Cardiovascular: Negative for chest pain.  Endocrine: Positive for cold intolerance.  Musculoskeletal: Positive for arthralgias, back pain, gait problem and myalgias.  Allergic/Immunologic: Positive for environmental allergies.  All other systems reviewed and are negative.   Past Medical History:  Diagnosis Date  . Hypertension   . Prediabetic coma     Past Surgical History:  Procedure Laterality Date  . TONSILLECTOMY      Family History  Problem Relation Age of Onset  . Diabetes Mother   . Heart failure Mother     Social History Social History   Tobacco Use  . Smoking status: Never Smoker  . Smokeless tobacco: Never Used  Substance Use Topics  . Alcohol use: No  . Drug use: No    No Known Allergies  Current Outpatient Medications  Medication Sig Dispense Refill  . albuterol (PROAIR HFA) 108 (90 Base) MCG/ACT inhaler Inhale 2 puffs into the lungs every 4 (four) hours as needed.    Marland Kitchen aspirin EC 81 MG tablet Take 1 tablet (81 mg total) by mouth daily.    Marland Kitchen atorvastatin (LIPITOR) 40 MG tablet Take 1 tablet (40 mg total) by mouth daily. 30 tablet 0  . carvedilol (COREG) 6.25 MG tablet Take 12.5 mg by mouth 2 (two) times daily with a meal.     . chlorthalidone (HYGROTON) 25 MG tablet Take 25 mg by mouth at bedtime.    . fluticasone (FLONASE) 50 MCG/ACT nasal spray Place 1 spray into both nostrils daily.  5  . lisinopril (PRINIVIL,ZESTRIL) 30 MG tablet Take 30 mg by mouth  daily.  11  . metFORMIN (GLUCOPHAGE) 500 MG tablet Take 2 tablets by mouth 2 (two) times daily.  1  . oxyCODONE-acetaminophen (PERCOCET) 7.5-325 MG tablet Take 1 tablet by mouth every 6 (six) hours as needed for moderate pain or severe pain (Must last 30 days). 130 tablet 0  . sitaGLIPtin (JANUVIA) 25 MG tablet Take 25 mg by mouth daily.     No current facility-administered medications for this visit.      Physical Exam  Blood pressure (!) 151/94, pulse 89, temperature 98.1 F (36.7 C), height 6' (1.829 m).  Constitutional: overall normal hygiene, normal nutrition, well developed, normal grooming, normal body habitus. Assistive device:none  Musculoskeletal: gait and station Limp waddling type gait, muscle tone and strength are normal, no tremors or atrophy is present.  .  Neurological: coordination overall normal.  Deep tendon reflex/nerve stretch intact.  Sensation normal.  Cranial nerves II-XII intact.   Skin:   Normal overall no scars, lesions, ulcers or rashes. No psoriasis.  Psychiatric: Alert and oriented x 3.  Recent memory intact, remote memory unclear.  Normal mood and affect. Well groomed.  Good eye contact.  Cardiovascular: overall no swelling, no varicosities, no edema bilaterally, normal temperatures of the legs and arms, no clubbing, cyanosis and good capillary refill.  Lymphatic: palpation is normal.  Spine/Pelvis examination:  Inspection:  Overall, sacoiliac joint benign and hips nontender; without crepitus or defects.   Thoracic spine inspection: Alignment normal without kyphosis present   Lumbar spine inspection:  Alignment  with normal lumbar lordosis, without scoliosis apparent.   Thoracic spine palpation:  without tenderness of spinal processes   Lumbar spine palpation: with tenderness of lumbar area; without tightness of lumbar muscles    Range of Motion:   Lumbar flexion, forward flexion is 20 without pain or tenderness    Lumbar extension is 5 without  pain or tenderness   Left lateral bend is Normal  without pain or tenderness   Right lateral bend is Normal without pain or tenderness   Straight leg raising is Normal   Strength & tone: Normal   Stability overall normal stability    All other systems reviewed and are negative   The patient has been educated about the nature of the problem(s) and counseled on treatment options.  The patient appeared to understand what I have discussed and is in agreement with it.  Encounter Diagnoses  Name Primary?  . 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 Morbid obesity due to excess calories [facility])     PLAN Call if any problems.  Precautions discussed.  Continue current medications.   Return to clinic 3 months   I have reviewed the [facility] Controlled Substance Reporting System web site prior to prescribing narcotic medicine for this patient.  Electronically Signed Darreld Mclean, MD 5/15/20198:13 AM
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
## 2017-08-18 NOTE — Discharge Instructions (Addendum)
Your x-rays are within normal limits.  You can expect muscle soreness over the next few days.  Please use Tylenol extra strength for soreness and pain.  Please use Flexeril for spasm pain.  Flexeril may cause drowsiness. This medication may cause drowsiness. Please do not drink, drive, or participate in activity that requires concentration while taking this medication.  Please see your primary physician or return to the emergency department if any changes in your condition, problems, or concerns.

## 2017-09-16 ENCOUNTER — Telehealth: Payer: Self-pay | Admitting: Orthopaedic Surgery

## 2017-09-16 MED ORDER — OXYCODONE-ACETAMINOPHEN 7.5-325 MG PO TABS: 1.0000 | ORAL_TABLET | Freq: Four times a day (QID) | ORAL | 0 refills | Status: DC | PRN

## 2017-09-16 NOTE — Telephone Encounter (Signed)
Patient requests refill on Oxycodone/Acetaminophen 7.5-325 mgs.  Qty  130  Sig: Take 1 tablet by mouth every 6 (six) hours as needed for moderate pain or severe pain (Must last 30 days).  Patient states he [address]  [facility] [address]

## 2017-09-23 ENCOUNTER — Emergency Department (HOSPITAL_COMMUNITY)
Admission: EM | Admit: 2017-09-23 | Discharge: 2017-09-23 | Disposition: A | Discharge status: Home / Self Care | Payer: Medicare Other | Attending: Emergency Medicine | Admitting: Emergency Medicine

## 2017-09-23 ENCOUNTER — Emergency Department (HOSPITAL_COMMUNITY): Payer: Medicare Other

## 2017-09-23 ENCOUNTER — Encounter (HOSPITAL_COMMUNITY): Payer: Self-pay | Admitting: Emergency Medicine

## 2017-09-23 DIAGNOSIS — Z7982 Long term (current) use of aspirin: Secondary | ICD-10-CM | POA: Diagnosis not present

## 2017-09-23 DIAGNOSIS — E119 Type 2 diabetes mellitus without complications: Secondary | ICD-10-CM | POA: Diagnosis not present

## 2017-09-23 DIAGNOSIS — X500XXA Overexertion from strenuous movement or load, initial encounter: Secondary | ICD-10-CM | POA: Insufficient documentation

## 2017-09-23 DIAGNOSIS — Y9389 Activity, other specified: Secondary | ICD-10-CM | POA: Diagnosis not present

## 2017-09-23 DIAGNOSIS — Y9289 Other specified places as the place of occurrence of the external cause: Secondary | ICD-10-CM | POA: Diagnosis not present

## 2017-09-23 DIAGNOSIS — Y999 Unspecified external cause status: Secondary | ICD-10-CM | POA: Diagnosis not present

## 2017-09-23 DIAGNOSIS — I10 Essential (primary) hypertension: Secondary | ICD-10-CM | POA: Insufficient documentation

## 2017-09-23 DIAGNOSIS — Z79899 Other long term (current) drug therapy: Secondary | ICD-10-CM | POA: Insufficient documentation

## 2017-09-23 DIAGNOSIS — S63501A Unspecified sprain of right wrist, initial encounter: Secondary | ICD-10-CM | POA: Diagnosis not present

## 2017-09-23 DIAGNOSIS — Z7984 Long term (current) use of oral hypoglycemic drugs: Secondary | ICD-10-CM | POA: Insufficient documentation

## 2017-09-23 DIAGNOSIS — S6981XA Other specified injuries of right wrist, hand and finger(s), initial encounter: Secondary | ICD-10-CM | POA: Diagnosis present

## 2017-09-23 NOTE — ED Notes (Signed)
Patient transported to X-ray 
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
## 2017-09-23 NOTE — Discharge Instructions (Signed)
Rest your wrist by using the Ace wrap as much as possible.  Apply heating pad to your wrist for 20 minutes several times daily.  Get rechecked by Dr. Hilda LiasKeeling if this treatment is not improving your wrist pain.  Your x-rays today are negative.

## 2017-10-14 ENCOUNTER — Telehealth: Payer: Self-pay | Admitting: Orthopaedic Surgery

## 2017-10-14 NOTE — Telephone Encounter (Signed)
Patient requests refill on Oxycodone/Acetaminophen 7.5-325  Mgs.  Qty  130   Sig: Take 1 tablet by mouth every 6 (six) hours as needed for moderate pain or severe pain (Must last 30 days).        Patient states he uses [facility] on [address]

## 2017-10-18 MED ORDER — OXYCODONE-ACETAMINOPHEN 7.5-325 MG PO TABS: 1.0000 | ORAL_TABLET | Freq: Four times a day (QID) | ORAL | 0 refills | Status: DC | PRN

## 2017-11-17 ENCOUNTER — Encounter: Payer: Self-pay | Admitting: Orthopaedic Surgery

## 2017-11-17 ENCOUNTER — Ambulatory Visit (INDEPENDENT_AMBULATORY_CARE_PROVIDER_SITE_OTHER): Payer: Medicare Other | Admitting: Orthopaedic Surgery

## 2017-11-17 VITALS — BP 136/88 | HR 66 | Ht 72.0 in | Wt >= 6400 oz

## 2017-11-17 DIAGNOSIS — Z6841 Body Mass Index (BMI) 40.0 and over, adult: Secondary | ICD-10-CM

## 2017-11-17 DIAGNOSIS — M25562 Pain in left knee: Secondary | ICD-10-CM

## 2017-11-17 DIAGNOSIS — M545 Low back pain: Secondary | ICD-10-CM

## 2017-11-17 DIAGNOSIS — G8929 Other chronic pain: Secondary | ICD-10-CM

## 2017-11-17 MED ORDER — OXYCODONE-ACETAMINOPHEN 7.5-325 MG PO TABS: 1.0000 | ORAL_TABLET | Freq: Four times a day (QID) | ORAL | 0 refills | Status: DC | PRN
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
## 2017-12-15 ENCOUNTER — Ambulatory Visit (INDEPENDENT_AMBULATORY_CARE_PROVIDER_SITE_OTHER): Payer: Medicare Other | Admitting: Orthopaedic Surgery

## 2017-12-15 ENCOUNTER — Encounter: Payer: Self-pay | Admitting: Orthopaedic Surgery

## 2017-12-15 DIAGNOSIS — Z6841 Body Mass Index (BMI) 40.0 and over, adult: Secondary | ICD-10-CM

## 2017-12-15 DIAGNOSIS — M25562 Pain in left knee: Secondary | ICD-10-CM | POA: Diagnosis not present

## 2017-12-15 DIAGNOSIS — M545 Low back pain: Secondary | ICD-10-CM | POA: Diagnosis not present

## 2017-12-15 DIAGNOSIS — G8929 Other chronic pain: Secondary | ICD-10-CM

## 2017-12-15 MED ORDER — OXYCODONE-ACETAMINOPHEN 7.5-325 MG PO TABS: ORAL_TABLET | ORAL | 0 refills | Status: DC

## 2017-12-15 NOTE — Progress Notes (Signed)
Patient Nathaniel Velasquez, male DOB:1981-01-19, 37 y.o. MCN:[id number]  Chief Complaint  Patient presents with  . Knee Pain    right    HPI  Nathaniel Velasquez is a 37 y.o. male who has chronic pain of the left knee and lower back pain.  He has no new trauma.  He has no giving way of the knee or redness.  He has no weakness.  He is active.    The patient meets the AMA guidelines for Morbid (severe) obesity with a BMI > 40.0 and I have recommended weight loss.  ROS  Review of Systems  HENT: Negative for congestion.   Respiratory: Negative for shortness of breath.   Cardiovascular: Negative for chest pain.  Endocrine: Positive for cold intolerance.  Musculoskeletal: Positive for arthralgias, back pain, gait problem and myalgias.  Allergic/Immunologic: Positive for environmental allergies.  All other systems reviewed and are negative.   All other systems reviewed and are negative.  The following is a summary of the past history medically, past history surgically, known current medicines, social history and family history.  This information is gathered electronically by the computer from prior information and documentation.  I review this each visit and have found including this information at this point in the chart is beneficial and informative.    Past Medical History:  Diagnosis Date  . Hypertension   . Prediabetic coma     Past Surgical History:  Procedure Laterality Date  . TONSILLECTOMY      Family History  Problem Relation Age of Onset  . Diabetes Mother   . Heart failure Mother     Social History Social History   Tobacco Use  . Smoking status: Never Smoker  . Smokeless tobacco: Never Used  Substance Use Topics  . Alcohol use: No  . Drug use: No    No Known Allergies  Current Outpatient Medications  Medication Sig Dispense Refill  . albuterol (PROAIR HFA) 108 (90 Base) MCG/ACT inhaler Inhale 2 puffs into the lungs every 4 (four) hours as needed.    Marland Kitchen  aspirin EC 81 MG tablet Take 1 tablet (81 mg total) by mouth daily.    Marland Kitchen atorvastatin (LIPITOR) 40 MG tablet Take 1 tablet (40 mg total) by mouth daily. 30 tablet 0  . carvedilol (COREG) 6.25 MG tablet Take 12.5 mg by mouth 2 (two) times daily with a meal.     . chlorthalidone (HYGROTON) 25 MG tablet Take 25 mg by mouth at bedtime.    . cyclobenzaprine (FLEXERIL) 10 MG tablet Take 1 tablet (10 mg total) by mouth 3 (three) times daily. 20 tablet 0  . fluticasone (FLONASE) 50 MCG/ACT nasal spray Place 1 spray into both nostrils daily.  5  . lisinopril (PRINIVIL,ZESTRIL) 30 MG tablet Take 30 mg by mouth daily.  11  . metFORMIN (GLUCOPHAGE) 500 MG tablet Take 2 tablets by mouth 2 (two) times daily.  1  . oxyCODONE-acetaminophen (PERCOCET) 7.5-325 MG tablet One tablet every four hours as need for pain. 130 tablet 0  . sitaGLIPtin (JANUVIA) 25 MG tablet Take 25 mg by mouth daily.     No current facility-administered medications for this visit.      Physical Exam  There were no vitals taken for this visit.  Constitutional: overall normal hygiene, normal nutrition, well developed, normal grooming, normal body habitus. Assistive device:none  Musculoskeletal: gait and station Limp waddling type gait, muscle tone and strength are normal, no tremors or atrophy is present.  Marland Kitchen  Neurological: coordination overall normal.  Deep tendon reflex/nerve stretch intact.  Sensation normal.  Cranial nerves II-XII intact.   Skin:   Normal overall no scars, lesions, ulcers or rashes. No psoriasis.  Psychiatric: Alert and oriented x 3.  Recent memory intact, remote memory unclear.  Normal mood and affect. Well groomed.  Good eye contact.  Cardiovascular: overall no swelling, no varicosities, no edema bilaterally, normal temperatures of the legs and arms, no clubbing, cyanosis and good capillary refill.  Lymphatic: palpation is normal.  His knees have crepitus, ROM right 0 to 90 and left 0 to 95 with effusions.   NV intact.  His back is tender.  ROM very limited secondary to obesity.  NV intact.  All other systems reviewed and are negative   The patient has been educated about the nature of the problem(s) and counseled on treatment options.  The patient appeared to understand what I have discussed and is in agreement with it.  Encounter Diagnoses  Name Primary?  . Chronic pain of left knee Yes  . Chronic midline low back pain without sciatica   . Body mass index 70 and over, adult (HCC)   . Morbid obesity (HCC)   . Morbid obesity due to excess calories [facility])     PLAN Call if any problems.  Precautions discussed.  Continue current medications.   Return to clinic 3 months   The patient has read and signed an Opioid Treatment Agreement which has been scanned and added to the medical record.  The patient understands the agreement and agrees to abide with it.  The patient has chronic pain that is being treated with an opioid which relieves the pain.  The patient understands potential complications with chronic opioid treatment.   I have reviewed the [place] Controlled Substance Reporting System web site prior to prescribing narcotic medicine for this patient.   Electronically Signed Darreld Mclean, MD 9/11/20193:12 PM

## 2018-01-24 ENCOUNTER — Other Ambulatory Visit: Payer: Self-pay | Admitting: Orthopedic Surgery

## 2018-01-24 MED ORDER — OXYCODONE-ACETAMINOPHEN 7.5-325 MG PO TABS: ORAL_TABLET | ORAL | 0 refills | Status: DC

## 2018-01-24 NOTE — Telephone Encounter (Signed)
Patient of Dr. Sanjuan Dame requests refill on Oxycodone/Acetaminophen 7.5-325  Mgs.  Qty  130  Sig: One tablet every four hours as need for pain.  [facility] [address].

## 2018-02-22 ENCOUNTER — Telehealth: Payer: Self-pay | Admitting: Orthopaedic Surgery

## 2018-02-22 MED ORDER — OXYCODONE-ACETAMINOPHEN 7.5-325 MG PO TABS: ORAL_TABLET | ORAL | 0 refills | Status: DC

## 2018-02-22 NOTE — Telephone Encounter (Signed)
Patient called for refill:  oxyCODONE-acetaminophen (PERCOCET) 7.5-325 MG tablet 130 tablet  -[facility], [address], [place]

## 2018-03-16 ENCOUNTER — Ambulatory Visit (INDEPENDENT_AMBULATORY_CARE_PROVIDER_SITE_OTHER): Payer: Medicare Other | Admitting: Orthopaedic Surgery

## 2018-03-16 ENCOUNTER — Encounter: Payer: Self-pay | Admitting: Orthopaedic Surgery

## 2018-03-16 ENCOUNTER — Ambulatory Visit: Payer: Medicare Other | Admitting: Orthopaedic Surgery

## 2018-03-16 VITALS — BP 139/89 | HR 101 | Ht 72.0 in | Wt >= 6400 oz

## 2018-03-16 DIAGNOSIS — Z6841 Body Mass Index (BMI) 40.0 and over, adult: Secondary | ICD-10-CM

## 2018-03-16 DIAGNOSIS — G8929 Other chronic pain: Secondary | ICD-10-CM

## 2018-03-16 DIAGNOSIS — M25562 Pain in left knee: Secondary | ICD-10-CM | POA: Diagnosis not present

## 2018-03-16 DIAGNOSIS — M545 Low back pain, unspecified: Secondary | ICD-10-CM

## 2018-03-16 MED ORDER — OXYCODONE-ACETAMINOPHEN 7.5-325 MG PO TABS: ORAL_TABLET | ORAL | 0 refills | Status: DC

## 2018-03-16 NOTE — Progress Notes (Signed)
Patient ZO:XWRUEA:Nathaniel Velasquez, male DOB:11/03/1980, 37 y.o. VWU:[id number]  Chief Complaint  Patient presents with  . Back Pain    lower back    HPI  Nathaniel Velasquez is a 37 y.o. male who has chronic lower back pain and knee pain bilaterally.  He has no new trauma.  Cold weather makes him worse. He has no numbness or weakness.   Body mass index is 87.34 kg/m.  The patient meets the AMA guidelines for Morbid (severe) obesity with a BMI > 40.0 and I have recommended weight loss.   ROS  Review of Systems  HENT: Negative for congestion.   Respiratory: Negative for shortness of breath.   Cardiovascular: Negative for chest pain.  Endocrine: Positive for cold intolerance.  Musculoskeletal: Positive for arthralgias, back pain, gait problem and myalgias.  Allergic/Immunologic: Positive for environmental allergies.  All other systems reviewed and are negative.   All other systems reviewed and are negative.  The following is a summary of the past history medically, past history surgically, known current medicines, social history and family history.  This information is gathered electronically by the computer from prior information and documentation.  I review this each visit and have found including this information at this point in the chart is beneficial and informative.    Past Medical History:  Diagnosis Date  . Hypertension   . Prediabetic coma     Past Surgical History:  Procedure Laterality Date  . TONSILLECTOMY      Family History  Problem Relation Age of Onset  . Diabetes Mother   . Heart failure Mother     Social History Social History   Tobacco Use  . Smoking status: Never Smoker  . Smokeless tobacco: Never Used  Substance Use Topics  . Alcohol use: No  . Drug use: No    No Known Allergies  Current Outpatient Medications  Medication Sig Dispense Refill  . albuterol (PROAIR HFA) 108 (90 Base) MCG/ACT inhaler Inhale 2 puffs into the lungs every 4 (four) hours  as needed.    Marland Kitchen. aspirin EC 81 MG tablet Take 1 tablet (81 mg total) by mouth daily.    Marland Kitchen. atorvastatin (LIPITOR) 40 MG tablet Take 1 tablet (40 mg total) by mouth daily. 30 tablet 0  . carvedilol (COREG) 6.25 MG tablet Take 12.5 mg by mouth 2 (two) times daily with a meal.     . chlorthalidone (HYGROTON) 25 MG tablet Take 25 mg by mouth at bedtime.    . cyclobenzaprine (FLEXERIL) 10 MG tablet Take 1 tablet (10 mg total) by mouth 3 (three) times daily. 20 tablet 0  . fluticasone (FLONASE) 50 MCG/ACT nasal spray Place 1 spray into both nostrils daily.  5  . lisinopril (PRINIVIL,ZESTRIL) 30 MG tablet Take 30 mg by mouth daily.  11  . metFORMIN (GLUCOPHAGE) 500 MG tablet Take 2 tablets by mouth 2 (two) times daily.  1  . oxyCODONE-acetaminophen (PERCOCET) 7.5-325 MG tablet One tablet every four hours as need for pain. 130 tablet 0  . sitaGLIPtin (JANUVIA) 25 MG tablet Take 25 mg by mouth daily.     No current facility-administered medications for this visit.      Physical Exam  Blood pressure 139/89, pulse (!) 101, height 6' (1.829 m), weight (!) 644 lb (292.1 kg).  Constitutional: overall normal hygiene, normal nutrition, well developed, normal grooming, normal body habitus. Assistive device:none  Musculoskeletal: gait and station Limp waddling gait, muscle tone and strength are normal, no tremors or  atrophy is present.  .  Neurological: coordination overall normal.  Deep tendon reflex/nerve stretch intact.  Sensation normal.  Cranial nerves II-XII intact.   Skin:   Normal overall no scars, lesions, ulcers or rashes. No psoriasis.  Psychiatric: Alert and oriented x 3.  Recent memory intact, remote memory unclear.  Normal mood and affect. Well groomed.  Good eye contact.  Cardiovascular: overall no swelling, no varicosities, no edema bilaterally, normal temperatures of the legs and arms, no clubbing, cyanosis and good capillary refill.  Lymphatic: palpation is normal.  Spine/Pelvis  examination:  Inspection:  Overall, sacoiliac joint benign and hips nontender; without crepitus or defects.   Thoracic spine inspection: Alignment normal without kyphosis present   Lumbar spine inspection:  Alignment  with normal lumbar lordosis, without scoliosis apparent.   Thoracic spine palpation:  without tenderness of spinal processes   Lumbar spine palpation: without tenderness of lumbar area; without tightness of lumbar muscles    Range of Motion:   Lumbar flexion, forward flexion is normal without pain or tenderness    Lumbar extension is full without pain or tenderness   Left lateral bend is normal without pain or tenderness   Right lateral bend is normal without pain or tenderness   Straight leg raising is normal  Strength & tone: normal   Stability overall normal stability  All other systems reviewed and are negative   The patient has been educated about the nature of the problem(s) and counseled on treatment options.  The patient appeared to understand what I have discussed and is in agreement with it.  Encounter Diagnoses  Name Primary?  . Chronic pain of left knee Yes  . Chronic midline low back pain without sciatica   . Body mass index 70 and over, adult (HCC)   . Morbid obesity (HCC)     PLAN Call if any problems.  Precautions discussed.  Continue current medications.   Return to clinic 3 months   The patient has read and signed an Opioid Treatment Agreement which has been scanned and added to the medical record.  The patient understands the agreement and agrees to abide with it.  The patient has chronic pain that is being treated with an opioid which relieves the pain.  The patient understands potential complications with chronic opioid treatment.   I have reviewed the [place] Controlled Substance Reporting System web site prior to prescribing narcotic medicine for this patient.   Electronically Signed Darreld Mclean, MD 12/11/20191:54  PM

## 2018-03-17 ENCOUNTER — Ambulatory Visit: Payer: Medicare Other | Admitting: Orthopaedic Surgery

## 2018-04-19 ENCOUNTER — Telehealth: Payer: Self-pay | Admitting: Orthopaedic Surgery

## 2018-04-19 MED ORDER — OXYCODONE-ACETAMINOPHEN 7.5-325 MG PO TABS: ORAL_TABLET | ORAL | 0 refills | Status: DC

## 2018-04-19 NOTE — Telephone Encounter (Signed)
Patient requests refill on Oxycodone/Acetaminophen 7.5-325 mgs.  Qty  7.5-325 mgs.  Qty  130  Sig: One tablet every four hours as need for pain.  Patient states he uses [facility] on [address]

## 2018-04-28 ENCOUNTER — Emergency Department (HOSPITAL_COMMUNITY)
Admission: EM | Admit: 2018-04-28 | Discharge: 2018-04-28 | Disposition: A | Discharge status: Home / Self Care | Payer: Medicare Other | Attending: Emergency Medicine | Admitting: Emergency Medicine

## 2018-04-28 ENCOUNTER — Emergency Department (HOSPITAL_COMMUNITY): Payer: Medicare Other

## 2018-04-28 ENCOUNTER — Encounter (HOSPITAL_COMMUNITY): Payer: Self-pay | Admitting: Emergency Medicine

## 2018-04-28 ENCOUNTER — Other Ambulatory Visit: Payer: Self-pay

## 2018-04-28 DIAGNOSIS — Z79899 Other long term (current) drug therapy: Secondary | ICD-10-CM | POA: Diagnosis not present

## 2018-04-28 DIAGNOSIS — I10 Essential (primary) hypertension: Secondary | ICD-10-CM | POA: Insufficient documentation

## 2018-04-28 DIAGNOSIS — Z7982 Long term (current) use of aspirin: Secondary | ICD-10-CM | POA: Insufficient documentation

## 2018-04-28 DIAGNOSIS — R51 Headache: Secondary | ICD-10-CM | POA: Diagnosis present

## 2018-04-28 DIAGNOSIS — Z7984 Long term (current) use of oral hypoglycemic drugs: Secondary | ICD-10-CM | POA: Insufficient documentation

## 2018-04-28 DIAGNOSIS — E119 Type 2 diabetes mellitus without complications: Secondary | ICD-10-CM | POA: Diagnosis not present

## 2018-04-28 MED ORDER — ACETAMINOPHEN 500 MG PO TABS
1000.0000 mg | ORAL_TABLET | Freq: Once | ORAL | Status: AC
  Administered 2018-04-28: 1000 mg via ORAL
  Filled 2018-04-28: qty 2
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
## 2018-05-03 ENCOUNTER — Emergency Department (HOSPITAL_COMMUNITY)
Admission: EM | Admit: 2018-05-03 | Discharge: 2018-05-03 | Disposition: A | Discharge status: Home / Self Care | Payer: Medicare Other | Attending: Emergency Medicine | Admitting: Emergency Medicine

## 2018-05-03 ENCOUNTER — Other Ambulatory Visit: Payer: Self-pay

## 2018-05-03 ENCOUNTER — Encounter (HOSPITAL_COMMUNITY): Payer: Self-pay | Admitting: Emergency Medicine

## 2018-05-03 DIAGNOSIS — J40 Bronchitis, not specified as acute or chronic: Secondary | ICD-10-CM

## 2018-05-03 DIAGNOSIS — I10 Essential (primary) hypertension: Secondary | ICD-10-CM | POA: Diagnosis not present

## 2018-05-03 DIAGNOSIS — Z79899 Other long term (current) drug therapy: Secondary | ICD-10-CM | POA: Diagnosis not present

## 2018-05-03 DIAGNOSIS — R05 Cough: Secondary | ICD-10-CM | POA: Diagnosis present

## 2018-05-03 LAB — CBG MONITORING, ED: Glucose-Capillary: 144 mg/dL — ABNORMAL HIGH (ref 70–99)

## 2018-05-03 MED ORDER — AMOXICILLIN 500 MG PO CAPS: 500.0000 mg | ORAL_CAPSULE | Freq: Three times a day (TID) | ORAL | 0 refills | Status: DC

## 2018-05-03 MED ORDER — AMOXICILLIN 250 MG PO CAPS
500.0000 mg | ORAL_CAPSULE | Freq: Once | ORAL | Status: AC
  Administered 2018-05-03: 500 mg via ORAL
  Filled 2018-05-03: qty 2

## 2018-05-03 MED ORDER — AZITHROMYCIN 250 MG PO TABS: 250.0000 mg | ORAL_TABLET | Freq: Every day | ORAL | 0 refills | Status: DC

## 2018-05-03 MED ORDER — ONDANSETRON HCL 8 MG PO TABS: 8.0000 mg | ORAL_TABLET | Freq: Three times a day (TID) | ORAL | 0 refills | Status: DC | PRN

## 2018-05-03 MED ORDER — DM-GUAIFENESIN ER 30-600 MG PO TB12
1.0000 | ORAL_TABLET | Freq: Two times a day (BID) | ORAL | Status: DC
  Administered 2018-05-03: 1 via ORAL
  Filled 2018-05-03: qty 1
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
## 2018-05-03 NOTE — Discharge Instructions (Addendum)
Drink plenty of fluids.  Monitor yourself for fever.  You can take ibuprofen 600 mg plus acetaminophen 1000 mg every 6 hours as needed.  Take Mucinex DM over-the-counter for cough.  You can take Claritin or Zyrtec over-the-counter 1 tablet daily for your stuffy and runny nose. Take the antibiotics until gone.  Recheck if you seem worse.

## 2018-05-03 NOTE — ED Triage Notes (Signed)
Pt with abdominal pain, sob, and chest pain. Recently seen here.

## 2018-05-18 ENCOUNTER — Telehealth: Payer: Self-pay | Admitting: Orthopaedic Surgery

## 2018-05-18 MED ORDER — OXYCODONE-ACETAMINOPHEN 7.5-325 MG PO TABS: ORAL_TABLET | ORAL | 0 refills | Status: DC

## 2018-05-18 NOTE — Telephone Encounter (Signed)
Patient called for refill: °oxyCODONE-acetaminophen (PERCOCET) 7.5-325 MG tablet 130 tablet   ° - [facility], [address], [place] ° °

## 2018-06-15 ENCOUNTER — Ambulatory Visit: Payer: Medicare Other | Admitting: Orthopaedic Surgery

## 2018-06-15 ENCOUNTER — Telehealth: Payer: Self-pay | Admitting: Orthopaedic Surgery

## 2018-06-15 MED ORDER — OXYCODONE-ACETAMINOPHEN 7.5-325 MG PO TABS: ORAL_TABLET | ORAL | 0 refills | Status: DC

## 2018-06-15 NOTE — Telephone Encounter (Signed)
Patient called for refill: oxyCODONE-acetaminophen (PERCOCET) 7.5-325 MG tablet 130 tablet    - [facility], [address], [place]

## 2018-06-16 ENCOUNTER — Other Ambulatory Visit: Payer: Self-pay

## 2018-06-16 ENCOUNTER — Encounter: Payer: Self-pay | Admitting: Orthopaedic Surgery

## 2018-06-16 ENCOUNTER — Ambulatory Visit (INDEPENDENT_AMBULATORY_CARE_PROVIDER_SITE_OTHER): Payer: Medicare Other | Admitting: Orthopaedic Surgery

## 2018-06-16 DIAGNOSIS — G8929 Other chronic pain: Secondary | ICD-10-CM

## 2018-06-16 DIAGNOSIS — M25562 Pain in left knee: Secondary | ICD-10-CM

## 2018-06-16 DIAGNOSIS — Z6841 Body Mass Index (BMI) 40.0 and over, adult: Secondary | ICD-10-CM

## 2018-06-16 DIAGNOSIS — M545 Low back pain: Secondary | ICD-10-CM | POA: Diagnosis not present

## 2018-06-16 NOTE — Progress Notes (Signed)
Patient UY:QIHKVQ Nathaniel Velasquez, male DOB:1980/10/26, 38 y.o. QVZ:[id number]  No chief complaint on file.   HPI  Nathaniel Velasquez is a 38 y.o. male who has chronic lower back pain and left knee pain.  He has more pain with activity.  He is massively obese.  He has no new trauma, no weakness.  Cold weather makes the pain worse.  He is taking his medicine.   There is no height or weight on file to calculate BMI.  The patient meets the AMA guidelines for Morbid (severe) obesity with a BMI > 40.0 and I have recommended weight loss.   ROS  Review of Systems  HENT: Negative for congestion.   Respiratory: Negative for shortness of breath.   Cardiovascular: Negative for chest pain.  Endocrine: Positive for cold intolerance.  Musculoskeletal: Positive for arthralgias, back pain, gait problem and myalgias.  Allergic/Immunologic: Positive for environmental allergies.  All other systems reviewed and are negative.   All other systems reviewed and are negative.  The following is a summary of the past history medically, past history surgically, known current medicines, social history and family history.  This information is gathered electronically by the computer from prior information and documentation.  I review this each visit and have found including this information at this point in the chart is beneficial and informative.    Past Medical History:  Diagnosis Date  . Hypertension   . Prediabetic coma     Past Surgical History:  Procedure Laterality Date  . TONSILLECTOMY      Family History  Problem Relation Age of Onset  . Diabetes Mother   . Heart failure Mother     Social History Social History   Tobacco Use  . Smoking status: Never Smoker  . Smokeless tobacco: Never Used  Substance Use Topics  . Alcohol use: No  . Drug use: No    Allergies  Allergen Reactions  . Clindamycin     Hives, anaphylaxis    Current Outpatient Medications  Medication Sig Dispense Refill  .  albuterol (PROAIR HFA) 108 (90 Base) MCG/ACT inhaler Inhale 2 puffs into the lungs every 4 (four) hours as needed.    Marland Kitchen amoxicillin (AMOXIL) 500 MG capsule Take 1 capsule (500 mg total) by mouth 3 (three) times daily. 30 capsule 0  . ARIPiprazole (ABILIFY) 5 MG tablet Take 5 mg by mouth.     Marland Kitchen aspirin EC 81 MG tablet Take 1 tablet (81 mg total) by mouth daily.    Marland Kitchen atorvastatin (LIPITOR) 40 MG tablet Take 1 tablet (40 mg total) by mouth daily. 30 tablet 0  . augmented betamethasone dipropionate (DIPROLENE-AF) 0.05 % cream   2  . azithromycin (ZITHROMAX) 250 MG tablet Take 1 tablet (250 mg total) by mouth daily. 6 tablet 0  . carvedilol (COREG) 6.25 MG tablet Take 12.5 mg by mouth 2 (two) times daily with a meal.     . chlorthalidone (HYGROTON) 25 MG tablet Take 25 mg by mouth at bedtime.    . cyclobenzaprine (FLEXERIL) 10 MG tablet Take 1 tablet (10 mg total) by mouth 3 (three) times daily. (Patient not taking: Reported on 04/28/2018) 20 tablet 0  . EPINEPHrine 0.3 mg/0.3 mL IJ SOAJ injection Inject into the muscle.    . esomeprazole (NEXIUM) 40 MG capsule Take 40 mg by mouth 2 (two) times daily before a meal.     . famotidine (PEPCID) 20 MG tablet Take 20 mg by mouth daily.     Marland Kitchen  fluticasone (FLONASE) 50 MCG/ACT nasal spray Place 1 spray into both nostrils daily.  5  . lisinopril (PRINIVIL,ZESTRIL) 30 MG tablet Take 40 mg by mouth daily.   11  . metFORMIN (GLUCOPHAGE) 500 MG tablet Take 1,000 mg by mouth 2 (two) times daily.   1  . ondansetron (ZOFRAN) 8 MG tablet Take 1 tablet (8 mg total) by mouth every 8 (eight) hours as needed for nausea or vomiting. 6 tablet 0  . oxyCODONE-acetaminophen (PERCOCET) 7.5-325 MG tablet One tablet every four hours as need for pain. 130 tablet 0  . sitaGLIPtin (JANUVIA) 25 MG tablet Take 25 mg by mouth daily.    Marland Kitchen triamcinolone cream (KENALOG) 0.1 %      No current facility-administered medications for this visit.      Physical Exam  There were no vitals  taken for this visit.  Constitutional: overall normal hygiene, normal nutrition, well developed, normal grooming, normal body habitus. Assistive device:none  Musculoskeletal: gait and station Limp none, muscle tone and strength are normal, no tremors or atrophy is present.  .  Neurological: coordination overall normal.  Deep tendon reflex/nerve stretch intact.  Sensation normal.  Cranial nerves II-XII intact.   Skin:   Normal overall no scars, lesions, ulcers or rashes. No psoriasis.  Psychiatric: Alert and oriented x 3.  Recent memory intact, remote memory unclear.  Normal mood and affect. Well groomed.  Good eye contact.  Cardiovascular: overall no swelling, no varicosities, no edema bilaterally, normal temperatures of the legs and arms, no clubbing, cyanosis and good capillary refill.  Lymphatic: palpation is normal.  Spine/Pelvis examination:  Inspection:  Overall, sacoiliac joint benign and hips nontender; without crepitus or defects.   Thoracic spine inspection: Alignment normal without kyphosis present   Lumbar spine inspection:  Alignment  with normal lumbar lordosis, without scoliosis apparent.   Thoracic spine palpation:  without tenderness of spinal processes   Lumbar spine palpation: without tenderness of lumbar area; without tightness of lumbar muscles    Range of Motion:   Lumbar flexion, forward flexion is normal without pain or tenderness    Lumbar extension is full without pain or tenderness   Left lateral bend is normal without pain or tenderness   Right lateral bend is normal without pain or tenderness   Straight leg raising is normal  Strength & tone: normal   Stability overall normal stability  All other systems reviewed and are negative   The patient has been educated about the nature of the problem(s) and counseled on treatment options.  The patient appeared to understand what I have discussed and is in agreement with it.  Encounter Diagnoses  Name  Primary?  . Chronic pain of left knee Yes  . Chronic midline low back pain without sciatica   . Body mass index 70 and over, adult (HCC)   . Morbid obesity (HCC)   . Morbid obesity due to excess calories [facility])     PLAN Call if any problems.  Precautions discussed.  Continue current medications.   Return to clinic 3 months   Electronically Signed Darreld Mclean, MD 3/12/20208:52 AM

## 2018-06-20 ENCOUNTER — Encounter: Payer: Self-pay | Admitting: Orthopaedic Surgery

## 2018-07-13 ENCOUNTER — Telehealth: Payer: Self-pay | Admitting: Orthopaedic Surgery

## 2018-07-13 MED ORDER — OXYCODONE-ACETAMINOPHEN 7.5-325 MG PO TABS: ORAL_TABLET | ORAL | 0 refills | Status: DC

## 2018-07-13 NOTE — Telephone Encounter (Signed)
Patient requests refill Oxycodone/Acetaminophen 7.5-325  Mgs.   Qty  130       Sig: One tablet every four hours as need for pain.     Patient states he uses [facility] on [address]

## 2018-08-10 ENCOUNTER — Telehealth: Payer: Self-pay | Admitting: Orthopaedic Surgery

## 2018-08-10 MED ORDER — OXYCODONE-ACETAMINOPHEN 7.5-325 MG PO TABS: ORAL_TABLET | ORAL | 0 refills | Status: DC

## 2018-08-10 NOTE — Telephone Encounter (Signed)
Patient requests refill:  oxyCODONE-acetaminophen (PERCOCET) 7.5-325 MG tablet 130 tablet 0  -[facility], [address] [place]

## 2018-09-07 ENCOUNTER — Telehealth: Payer: Self-pay | Admitting: Orthopaedic Surgery

## 2018-09-07 NOTE — Telephone Encounter (Signed)
Patient requests refill on Oxycodone/Acetaminophen 7.5-325  Mgs.  Qty  130  Sig: One tablet every four hours as need for pain.       Patient states he uses [facility] on [address] 

## 2018-09-08 MED ORDER — OXYCODONE-ACETAMINOPHEN 7.5-325 MG PO TABS: ORAL_TABLET | ORAL | 0 refills | Status: DC

## 2018-09-15 ENCOUNTER — Other Ambulatory Visit: Payer: Self-pay

## 2018-09-15 ENCOUNTER — Encounter: Payer: Self-pay | Admitting: Orthopaedic Surgery

## 2018-09-15 ENCOUNTER — Ambulatory Visit (INDEPENDENT_AMBULATORY_CARE_PROVIDER_SITE_OTHER): Payer: Medicare Other | Admitting: Orthopaedic Surgery

## 2018-09-15 VITALS — BP 134/87 | HR 98 | Temp 97.0°F | Ht 72.0 in | Wt >= 6400 oz

## 2018-09-15 DIAGNOSIS — M25562 Pain in left knee: Secondary | ICD-10-CM

## 2018-09-15 DIAGNOSIS — G8929 Other chronic pain: Secondary | ICD-10-CM | POA: Diagnosis not present

## 2018-09-15 DIAGNOSIS — Z6841 Body Mass Index (BMI) 40.0 and over, adult: Secondary | ICD-10-CM
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
## 2018-10-06 ENCOUNTER — Telehealth: Payer: Self-pay | Admitting: Orthopedic Surgery

## 2018-10-06 MED ORDER — OXYCODONE-ACETAMINOPHEN 7.5-325 MG PO TABS: ORAL_TABLET | ORAL | 0 refills | Status: DC

## 2018-10-06 NOTE — Telephone Encounter (Signed)
Patient call received for refill: oxyCODONE-acetaminophen (PERCOCET) 7.5-325 MG tablet 130 tablet  -[facility], [address] [place]

## 2018-11-08 ENCOUNTER — Telehealth: Payer: Self-pay | Admitting: Orthopaedic Surgery

## 2018-11-08 NOTE — Telephone Encounter (Signed)
Patient called for refill: oxyCODONE-acetaminophen (PERCOCET) 7.5-325 MG tablet  Quantity 130  - [facility], [address], [place]

## 2018-11-09 MED ORDER — OXYCODONE-ACETAMINOPHEN 7.5-325 MG PO TABS: ORAL_TABLET | ORAL | 0 refills | Status: DC

## 2018-12-07 ENCOUNTER — Other Ambulatory Visit: Payer: Self-pay | Admitting: Radiology

## 2018-12-07 NOTE — Telephone Encounter (Signed)
Patient is asking for a refill of oxycodone, uses [facility] [address]

## 2018-12-08 MED ORDER — OXYCODONE-ACETAMINOPHEN 7.5-325 MG PO TABS: ORAL_TABLET | ORAL | 0 refills | Status: DC

## 2018-12-15 ENCOUNTER — Ambulatory Visit (INDEPENDENT_AMBULATORY_CARE_PROVIDER_SITE_OTHER): Payer: Medicare Other | Admitting: Orthopaedic Surgery

## 2018-12-15 ENCOUNTER — Encounter: Payer: Self-pay | Admitting: Orthopaedic Surgery

## 2018-12-15 ENCOUNTER — Other Ambulatory Visit: Payer: Self-pay

## 2018-12-15 DIAGNOSIS — M25562 Pain in left knee: Secondary | ICD-10-CM | POA: Diagnosis not present

## 2018-12-15 DIAGNOSIS — Z6841 Body Mass Index (BMI) 40.0 and over, adult: Secondary | ICD-10-CM

## 2018-12-15 DIAGNOSIS — M545 Low back pain, unspecified: Secondary | ICD-10-CM

## 2018-12-15 DIAGNOSIS — G8929 Other chronic pain: Secondary | ICD-10-CM

## 2018-12-15 NOTE — Progress Notes (Signed)
Patient UY:QIHKVQ [name], male DOB:1980/12/09, 38 y.o. QVZ:[id number]  Chief Complaint  Patient presents with  . Knee Pain    L/still hurting    HPI  STILLMAN BUENGER is a 38 y.o. male who has chronic lower back pain.  He has no new trauma.  He has no weakness.  He has pain with increased activity.  He has bilateral knee pain but no giving way.  He has no redness.  He is active and taking his medicine.   There is no height or weight on file to calculate BMI.  ROS  Review of Systems  Constitutional: Positive for activity change.  HENT: Negative for congestion.   Respiratory: Negative for shortness of breath.   Cardiovascular: Negative for chest pain.  Endocrine: Positive for cold intolerance.  Musculoskeletal: Positive for arthralgias, back pain, gait problem and myalgias.  Allergic/Immunologic: Positive for environmental allergies.  All other systems reviewed and are negative.   All other systems reviewed and are negative.  The following is a summary of the past history medically, past history surgically, known current medicines, social history and family history.  This information is gathered electronically by the computer from prior information and documentation.  I review this each visit and have found including this information at this point in the chart is beneficial and informative.    Past Medical History:  Diagnosis Date  . Hypertension   . Prediabetic coma     Past Surgical History:  Procedure Laterality Date  . TONSILLECTOMY      Family History  Problem Relation Age of Onset  . Diabetes Mother   . Heart failure Mother     Social History Social History   Tobacco Use  . Smoking status: Never Smoker  . Smokeless tobacco: Never Used  Substance Use Topics  . Alcohol use: No  . Drug use: No    Allergies  Allergen Reactions  . Clindamycin     Hives, anaphylaxis    Current Outpatient Medications  Medication Sig Dispense Refill  . albuterol (PROAIR  HFA) 108 (90 Base) MCG/ACT inhaler Inhale 2 puffs into the lungs every 4 (four) hours as needed.    Marland Kitchen amoxicillin (AMOXIL) 500 MG capsule Take 1 capsule (500 mg total) by mouth 3 (three) times daily. 30 capsule 0  . ARIPiprazole (ABILIFY) 5 MG tablet Take 5 mg by mouth.     Marland Kitchen aspirin EC 81 MG tablet Take 1 tablet (81 mg total) by mouth daily.    Marland Kitchen atorvastatin (LIPITOR) 40 MG tablet Take 1 tablet (40 mg total) by mouth daily. 30 tablet 0  . augmented betamethasone dipropionate (DIPROLENE-AF) 0.05 % cream   2  . azithromycin (ZITHROMAX) 250 MG tablet Take 1 tablet (250 mg total) by mouth daily. 6 tablet 0  . carvedilol (COREG) 6.25 MG tablet Take 12.5 mg by mouth 2 (two) times daily with a meal.     . chlorthalidone (HYGROTON) 25 MG tablet Take 25 mg by mouth at bedtime.    . cyclobenzaprine (FLEXERIL) 10 MG tablet Take 1 tablet (10 mg total) by mouth 3 (three) times daily. 20 tablet 0  . EPINEPHrine 0.3 mg/0.3 mL IJ SOAJ injection Inject into the muscle.    . esomeprazole (NEXIUM) 40 MG capsule Take 40 mg by mouth 2 (two) times daily before a meal.     . famotidine (PEPCID) 20 MG tablet Take 20 mg by mouth daily.     . fluticasone (FLONASE) 50 MCG/ACT nasal spray Place 1  spray into both nostrils daily.  5  . lisinopril (PRINIVIL,ZESTRIL) 30 MG tablet Take 40 mg by mouth daily.   11  . metFORMIN (GLUCOPHAGE) 500 MG tablet Take 1,000 mg by mouth 2 (two) times daily.   1  . ondansetron (ZOFRAN) 8 MG tablet Take 1 tablet (8 mg total) by mouth every 8 (eight) hours as needed for nausea or vomiting. 6 tablet 0  . oxyCODONE-acetaminophen (PERCOCET) 7.5-325 MG tablet One tablet every four hours as need for pain. 130 tablet 0  . sitaGLIPtin (JANUVIA) 25 MG tablet Take 25 mg by mouth daily.    Marland Kitchen. triamcinolone cream (KENALOG) 0.1 %      No current facility-administered medications for this visit.      Physical Exam  There were no vitals taken for this visit.  Constitutional: overall normal hygiene,  normal nutrition, well developed, normal grooming, normal body habitus. Assistive device:none  Musculoskeletal: gait and station Limp none, muscle tone and strength are normal, no tremors or atrophy is present.  .  Neurological: coordination overall normal.  Deep tendon reflex/nerve stretch intact.  Sensation normal.  Cranial nerves II-XII intact.   Skin:   Normal overall no scars, lesions, ulcers or rashes. No psoriasis.  Psychiatric: Alert and oriented x 3.  Recent memory intact, remote memory unclear.  Normal mood and affect. Well groomed.  Good eye contact.  Cardiovascular: overall no swelling, no varicosities, no edema bilaterally, normal temperatures of the legs and arms, no clubbing, cyanosis and good capillary refill.  Lymphatic: palpation is normal.  Spine/Pelvis examination:  Inspection:  Overall, sacoiliac joint benign and hips nontender; without crepitus or defects.   Thoracic spine inspection: Alignment normal without kyphosis present   Lumbar spine inspection:  Alignment  with normal lumbar lordosis, without scoliosis apparent.   Thoracic spine palpation:  without tenderness of spinal processes   Lumbar spine palpation: without tenderness of lumbar area; without tightness of lumbar muscles    Range of Motion:   Lumbar flexion, forward flexion is normal without pain or tenderness    Lumbar extension is full without pain or tenderness   Left lateral bend is normal without pain or tenderness   Right lateral bend is normal without pain or tenderness   Straight leg raising is normal  Strength & tone: normal   Stability overall normal stability  All other systems reviewed and are negative   The patient has been educated about the nature of the problem(s) and counseled on treatment options.  The patient appeared to understand what I have discussed and is in agreement with it.  Encounter Diagnoses  Name Primary?  . Chronic pain of left knee Yes  . Body mass index 70  and over, adult (HCC)   . Morbid obesity (HCC)   . Chronic midline low back pain without sciatica     PLAN Call if any problems.  Precautions discussed.  Continue current medications.   Return to clinic 3 months   Electronically Signed Darreld McleanWayne Lateya Dauria, MD 9/10/20208:18 AM

## 2019-01-05 ENCOUNTER — Telehealth: Payer: Self-pay | Admitting: Orthopaedic Surgery

## 2019-01-05 NOTE — Telephone Encounter (Signed)
Patient requests refill on Oxycodone/Acetaminophen 7.5-325  Mgs.  Qty  130  Sig: One tablet every four hours as need for pain.       Patient states he uses [facility] on [address] 

## 2019-01-10 MED ORDER — OXYCODONE-ACETAMINOPHEN 7.5-325 MG PO TABS: ORAL_TABLET | ORAL | 0 refills | Status: DC

## 2019-02-07 ENCOUNTER — Telehealth: Payer: Self-pay | Admitting: Orthopaedic Surgery

## 2019-02-07 MED ORDER — OXYCODONE-ACETAMINOPHEN 7.5-325 MG PO TABS: ORAL_TABLET | ORAL | 0 refills | Status: DC

## 2019-02-07 NOTE — Telephone Encounter (Signed)
Patient requests refill on Oxycodone/Acetaminophen 7.5-325  Mgs.  Qty  130  Sig: One tablet every four hours as need for pain.  Patient uses [facility] on [address] 

## 2019-03-06 ENCOUNTER — Telehealth: Payer: Self-pay

## 2019-03-06 MED ORDER — OXYCODONE-ACETAMINOPHEN 7.5-325 MG PO TABS: ORAL_TABLET | ORAL | 0 refills | Status: DC

## 2019-03-06 NOTE — Telephone Encounter (Signed)
Oxycodone-Acetaminophen  7.5/325mg   Qty 130 Tablets  PATIENT USES [facility] ON [address]

## 2019-03-14 ENCOUNTER — Other Ambulatory Visit: Payer: Self-pay

## 2019-03-14 ENCOUNTER — Encounter: Payer: Self-pay | Admitting: Orthopaedic Surgery

## 2019-03-14 ENCOUNTER — Ambulatory Visit (INDEPENDENT_AMBULATORY_CARE_PROVIDER_SITE_OTHER): Payer: Medicare Other | Admitting: Orthopaedic Surgery

## 2019-03-14 VITALS — BP 151/77 | HR 95 | Ht 72.0 in

## 2019-03-14 DIAGNOSIS — M25562 Pain in left knee: Secondary | ICD-10-CM | POA: Diagnosis not present

## 2019-03-14 DIAGNOSIS — G8929 Other chronic pain: Secondary | ICD-10-CM | POA: Diagnosis not present

## 2019-03-14 DIAGNOSIS — Z6841 Body Mass Index (BMI) 40.0 and over, adult: Secondary | ICD-10-CM

## 2019-03-14 NOTE — Progress Notes (Signed)
Patient DE:YCXKGY Nathaniel Velasquez, male DOB:04-16-1980, 38 y.o. JEH:[id number]  Chief Complaint  Patient presents with  . Back Pain  . Knee Pain    left     HPI  Nathaniel Velasquez is a 38 y.o. male who has chronic pain of the lower back.  He has no new trauma, no weakness.  He is morbidly obese.  He had increased pain about 10 days ago but is slightly better today.  He is taking his medicine.   Body mass index is 81.37 kg/m.  ROS  Review of Systems  Constitutional: Positive for activity change.  HENT: Negative for congestion.   Respiratory: Negative for shortness of breath.   Cardiovascular: Negative for chest pain.  Endocrine: Positive for cold intolerance.  Musculoskeletal: Positive for arthralgias, back pain, gait problem and myalgias.  Allergic/Immunologic: Positive for environmental allergies.  All other systems reviewed and are negative.   All other systems reviewed and are negative.  The following is a summary of the past history medically, past history surgically, known current medicines, social history and family history.  This information is gathered electronically by the computer from prior information and documentation.  I review this each visit and have found including this information at this point in the chart is beneficial and informative.    Past Medical History:  Diagnosis Date  . Hypertension   . Prediabetic coma     Past Surgical History:  Procedure Laterality Date  . TONSILLECTOMY      Family History  Problem Relation Age of Onset  . Diabetes Mother   . Heart failure Mother     Social History Social History   Tobacco Use  . Smoking status: Never Smoker  . Smokeless tobacco: Never Used  Substance Use Topics  . Alcohol use: No  . Drug use: No    Allergies  Allergen Reactions  . Clindamycin     Hives, anaphylaxis    Current Outpatient Medications  Medication Sig Dispense Refill  . albuterol (PROAIR HFA) 108 (90 Base) MCG/ACT inhaler Inhale 2  puffs into the lungs every 4 (four) hours as needed.    Marland Kitchen aspirin EC 81 MG tablet Take 1 tablet (81 mg total) by mouth daily.    Marland Kitchen augmented betamethasone dipropionate (DIPROLENE-AF) 0.05 % cream   2  . azithromycin (ZITHROMAX) 250 MG tablet Take 1 tablet (250 mg total) by mouth daily. 6 tablet 0  . carvedilol (COREG) 6.25 MG tablet Take 12.5 mg by mouth 2 (two) times daily with a meal.     . chlorthalidone (HYGROTON) 25 MG tablet Take 25 mg by mouth at bedtime.    . cyclobenzaprine (FLEXERIL) 10 MG tablet Take 1 tablet (10 mg total) by mouth 3 (three) times daily. 20 tablet 0  . EPINEPHrine 0.3 mg/0.3 mL IJ SOAJ injection Inject into the muscle.    . esomeprazole (NEXIUM) 40 MG capsule Take 40 mg by mouth 2 (two) times daily before a meal.     . famotidine (PEPCID) 20 MG tablet Take 20 mg by mouth daily.     . fluticasone (FLONASE) 50 MCG/ACT nasal spray Place 1 spray into both nostrils daily.  5  . lisinopril (PRINIVIL,ZESTRIL) 30 MG tablet Take 40 mg by mouth daily.   11  . metFORMIN (GLUCOPHAGE) 500 MG tablet Take 1,000 mg by mouth 2 (two) times daily.   1  . ondansetron (ZOFRAN) 8 MG tablet Take 1 tablet (8 mg total) by mouth every 8 (eight) hours as needed  for nausea or vomiting. 6 tablet 0  . oxyCODONE-acetaminophen (PERCOCET) 7.5-325 MG tablet One tablet every four hours as need for pain. 130 tablet 0  . triamcinolone cream (KENALOG) 0.1 %     . amoxicillin (AMOXIL) 500 MG capsule Take 1 capsule (500 mg total) by mouth 3 (three) times daily. (Patient not taking: Reported on 03/14/2019) 30 capsule 0  . ARIPiprazole (ABILIFY) 5 MG tablet Take 5 mg by mouth.     Marland Kitchen atorvastatin (LIPITOR) 40 MG tablet Take 1 tablet (40 mg total) by mouth daily. 30 tablet 0  . sitaGLIPtin (JANUVIA) 25 MG tablet Take 25 mg by mouth daily.     No current facility-administered medications for this visit.      Physical Exam  Blood pressure (!) 151/77, pulse 95, height 6' (1.829 m).  Constitutional: overall  normal hygiene, normal nutrition, well developed, normal grooming, normal body habitus. Assistive device:none  Musculoskeletal: gait and station Limp none, muscle tone and strength are normal, no tremors or atrophy is present.  .  Neurological: coordination overall normal.  Deep tendon reflex/nerve stretch intact.  Sensation normal.  Cranial nerves II-XII intact.   Skin:   Normal overall no scars, lesions, ulcers or rashes. No psoriasis.  Psychiatric: Alert and oriented x 3.  Recent memory intact, remote memory unclear.  Normal mood and affect. Well groomed.  Good eye contact.  Cardiovascular: overall no swelling, no varicosities, no edema bilaterally, normal temperatures of the legs and arms, no clubbing, cyanosis and good capillary refill.  Lymphatic: palpation is normal.  Spine/Pelvis examination:  Inspection:  Overall, sacoiliac joint benign and hips nontender; without crepitus or defects.   Thoracic spine inspection: Alignment normal without kyphosis present   Lumbar spine inspection:  Alignment  with normal lumbar lordosis, without scoliosis apparent.   Thoracic spine palpation:  without tenderness of spinal processes   Lumbar spine palpation: without tenderness of lumbar area; without tightness of lumbar muscles    Range of Motion:   Lumbar flexion, forward flexion is normal without pain or tenderness    Lumbar extension is full without pain or tenderness   Left lateral bend is normal without pain or tenderness   Right lateral bend is normal without pain or tenderness   Straight leg raising is normal  Strength & tone: normal   Stability overall normal stability  All other systems reviewed and are negative   The patient has been educated about the nature of the problem(s) and counseled on treatment options.  The patient appeared to understand what I have discussed and is in agreement with it.  Encounter Diagnoses  Name Primary?  . Chronic pain of left knee Yes  . Body  mass index 70 and over, adult ([facility]CC)   . Morbid obesity ([facility])      PLAN Call if any problems.  Precautions discussed.  Continue current medications.   Return to clinic 3 months   Electronically Signed Darreld Mclean, MD 12/8/20208:21 AM

## 2019-03-16 ENCOUNTER — Ambulatory Visit: Payer: Medicare Other | Admitting: Orthopaedic Surgery

## 2019-04-04 ENCOUNTER — Telehealth: Payer: Self-pay | Admitting: Orthopaedic Surgery

## 2019-04-04 MED ORDER — OXYCODONE-ACETAMINOPHEN 7.5-325 MG PO TABS: ORAL_TABLET | ORAL | 0 refills | Status: DC

## 2019-04-04 NOTE — Telephone Encounter (Signed)
Patient requests refill on Oxycodone/Acetaminophen 7.5-325  Mgs.  Qty  130  Sig: One tablet every four hours as need for pain.       Patient states he uses [facility] on [address]

## 2019-05-03 ENCOUNTER — Other Ambulatory Visit: Payer: Self-pay

## 2019-05-04 MED ORDER — OXYCODONE-ACETAMINOPHEN 7.5-325 MG PO TABS: ORAL_TABLET | ORAL | 0 refills | Status: DC

## 2019-05-10 DIAGNOSIS — E782 Mixed hyperlipidemia: Secondary | ICD-10-CM | POA: Insufficient documentation

## 2019-05-10 DIAGNOSIS — G4733 Obstructive sleep apnea (adult) (pediatric): Secondary | ICD-10-CM | POA: Insufficient documentation
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
## 2019-06-01 ENCOUNTER — Other Ambulatory Visit: Payer: Self-pay | Admitting: Orthopaedic Surgery

## 2019-06-01 MED ORDER — OXYCODONE-ACETAMINOPHEN 7.5-325 MG PO TABS: ORAL_TABLET | ORAL | 0 refills | Status: DC

## 2019-06-13 ENCOUNTER — Ambulatory Visit (INDEPENDENT_AMBULATORY_CARE_PROVIDER_SITE_OTHER): Payer: Medicare Other | Admitting: Orthopaedic Surgery

## 2019-06-13 ENCOUNTER — Other Ambulatory Visit: Payer: Self-pay

## 2019-06-13 ENCOUNTER — Encounter: Payer: Self-pay | Admitting: Orthopaedic Surgery

## 2019-06-13 VITALS — BP 140/90 | Temp 97.3°F | Ht 72.0 in | Wt >= 6400 oz

## 2019-06-13 DIAGNOSIS — M545 Low back pain, unspecified: Secondary | ICD-10-CM

## 2019-06-13 DIAGNOSIS — Z6841 Body Mass Index (BMI) 40.0 and over, adult: Secondary | ICD-10-CM | POA: Diagnosis not present

## 2019-06-13 DIAGNOSIS — G8929 Other chronic pain: Secondary | ICD-10-CM | POA: Diagnosis not present

## 2019-06-13 NOTE — Progress Notes (Signed)
Health History Questions  Smoker? No     Ready to quit? N/A Diabetic? Yes      On Insulin? No  Hypertension? Yes      HTN Meds Yes   carvedilol Breathing problems or COPD? No  Any recent changes to your health? No

## 2019-06-13 NOTE — Progress Notes (Signed)
Patient Nathaniel Velasquez, male DOB:1980/11/25, 39 y.o. OAC:[id number]  Chief Complaint  Patient presents with  . Back Problem    back pain, leg pain, occas n/t    HPI  Nathaniel Velasquez is a 39 y.o. male who has chronic lower back pain.  He is stable.  He has some more right sided pain  He has no new trauma.  He is still gaining weight.     Body mass index is 81.37 kg/m.  ROS  Review of Systems  Constitutional: Positive for activity change.  HENT: Negative for congestion.   Respiratory: Negative for shortness of breath.   Cardiovascular: Negative for chest pain.  Endocrine: Positive for cold intolerance.  Musculoskeletal: Positive for arthralgias, back pain, gait problem and myalgias.  Allergic/Immunologic: Positive for environmental allergies.  All other systems reviewed and are negative.   All other systems reviewed and are negative.  The following is a summary of the past history medically, past history surgically, known current medicines, social history and family history.  This information is gathered electronically by the computer from prior information and documentation.  I review this each visit and have found including this information at this point in the chart is beneficial and informative.    Past Medical History:  Diagnosis Date  . Hypertension   . Prediabetic coma     Past Surgical History:  Procedure Laterality Date  . TONSILLECTOMY      Family History  Problem Relation Age of Onset  . Diabetes Mother   . Heart failure Mother     Social History Social History   Tobacco Use  . Smoking status: Never Smoker  . Smokeless tobacco: Never Used  Substance Use Topics  . Alcohol use: No  . Drug use: No    Allergies  Allergen Reactions  . Clindamycin     Hives, anaphylaxis    Current Outpatient Medications  Medication Sig Dispense Refill  . ARIPiprazole (ABILIFY) 5 MG tablet Take by mouth.    Marland Kitchen aspirin EC 81 MG tablet Take 1 tablet (81 mg total)  by mouth daily.    . carvedilol (COREG) 6.25 MG tablet Take 12.5 mg by mouth 2 (two) times daily with a meal.     . chlorthalidone (HYGROTON) 25 MG tablet Take 25 mg by mouth at bedtime.    . empagliflozin (JARDIANCE) 10 MG TABS tablet Take by mouth.    . EPINEPHrine 0.3 mg/0.3 mL IJ SOAJ injection Inject into the muscle.    . esomeprazole (NEXIUM) 40 MG capsule Take 40 mg by mouth 2 (two) times daily before a meal.     . famotidine (PEPCID) 20 MG tablet Take 20 mg by mouth daily.     . ferrous sulfate 325 (65 FE) MG tablet TK 1 T PO QD    . lisinopril (PRINIVIL,ZESTRIL) 30 MG tablet Take 40 mg by mouth daily.   11  . metFORMIN (GLUCOPHAGE) 500 MG tablet Take 1,000 mg by mouth 2 (two) times daily.   1  . oxyCODONE-acetaminophen (PERCOCET) 7.5-325 MG tablet One tablet every four hours as need for pain. 130 tablet 0  . atorvastatin (LIPITOR) 40 MG tablet Take 1 tablet (40 mg total) by mouth daily. 30 tablet 0  . augmented betamethasone dipropionate (DIPROLENE-AF) 0.05 % cream   2  . fluticasone (FLONASE) 50 MCG/ACT nasal spray Place 1 spray into both nostrils daily.  5  . sitaGLIPtin (JANUVIA) 25 MG tablet Take 25 mg by mouth daily.    Marland Kitchen  triamcinolone cream (KENALOG) 0.1 %      No current facility-administered medications for this visit.     Physical Exam  Blood pressure 140/90, temperature (!) 97.3 F (36.3 C), height 6' (1.829 m), weight (!) 600 lb (272.2 kg).  Constitutional: overall normal hygiene, normal nutrition, well developed, normal grooming, normal body habitus. Assistive device:none  Musculoskeletal: gait and station Limp none, muscle tone and strength are normal, no tremors or atrophy is present.  .  Neurological: coordination overall normal.  Deep tendon reflex/nerve stretch intact.  Sensation normal.  Cranial nerves II-XII intact.   Skin:   Normal overall no scars, lesions, ulcers or rashes. No psoriasis.  Psychiatric: Alert and oriented x 3.  Recent memory intact,  remote memory unclear.  Normal mood and affect. Well groomed.  Good eye contact.  Cardiovascular: overall no swelling, no varicosities, no edema bilaterally, normal temperatures of the legs and arms, no clubbing, cyanosis and good capillary refill.  Lymphatic: palpation is normal.  Spine/Pelvis examination:  Inspection:  Overall, sacoiliac joint benign and hips nontender; without crepitus or defects.   Thoracic spine inspection: Alignment normal without kyphosis present   Lumbar spine inspection:  Alignment  with normal lumbar lordosis, without scoliosis apparent.   Thoracic spine palpation:  without tenderness of spinal processes   Lumbar spine palpation: without tenderness of lumbar area; without tightness of lumbar muscles    Range of Motion:   Lumbar flexion, forward flexion is normal without pain or tenderness    Lumbar extension is full without pain or tenderness   Left lateral bend is normal without pain or tenderness   Right lateral bend is normal without pain or tenderness   Straight leg raising is normal  Strength & tone: normal   Stability overall normal stability  All other systems reviewed and are negative   The patient has been educated about the nature of the problem(s) and counseled on treatment options.  The patient appeared to understand what I have discussed and is in agreement with it.  Encounter Diagnoses  Name Primary?  . 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 Body mass index 70 and over, adult (HCC)   . Morbid obesity (HCC)     PLAN Call if any problems.  Precautions discussed.  Continue current medications.   Return to clinic 3 months   Electronically Signed Darreld Mclean, MD 3/9/20218:28 AM

## 2019-06-27 ENCOUNTER — Other Ambulatory Visit: Payer: Self-pay | Admitting: Orthopaedic Surgery

## 2019-06-27 MED ORDER — INSULIN LISPRO 100 UNIT/ML ~~LOC~~ SOLN
0.00 | SUBCUTANEOUS | Status: DC
Start: 2019-06-27 — End: 2019-06-27

## 2019-06-27 MED ORDER — DEXTROSE 50 % IV SOLN
12.50 | INTRAVENOUS | Status: DC
Start: ? — End: 2019-06-27

## 2019-06-27 MED ORDER — OXYCODONE-ACETAMINOPHEN 7.5-325 MG PO TABS: ORAL_TABLET | ORAL | 0 refills | Status: DC

## 2019-06-27 MED ORDER — GENERIC EXTERNAL MEDICATION
Status: DC
Start: ? — End: 2019-06-27

## 2019-06-27 MED ORDER — ENOXAPARIN SODIUM 40 MG/0.4ML ~~LOC~~ SOLN
40.00 | SUBCUTANEOUS | Status: DC
Start: 2019-06-27 — End: 2019-06-27

## 2019-06-27 MED ORDER — ATORVASTATIN CALCIUM 40 MG PO TABS
40.00 | ORAL_TABLET | ORAL | Status: DC
Start: 2019-06-28 — End: 2019-06-27

## 2019-06-27 MED ORDER — MELATONIN 3 MG PO TABS
3.00 | ORAL_TABLET | ORAL | Status: DC
Start: ? — End: 2019-06-27

## 2019-06-27 MED ORDER — ALUM & MAG HYDROXIDE-SIMETH 400-400-40 MG/5ML PO SUSP
30.00 | ORAL | Status: DC
Start: ? — End: 2019-06-27

## 2019-06-27 MED ORDER — ACETAMINOPHEN 325 MG PO TABS
650.00 | ORAL_TABLET | ORAL | Status: DC
Start: ? — End: 2019-06-27

## 2019-06-27 MED ORDER — SENNOSIDES 8.6 MG PO TABS
2.00 | ORAL_TABLET | ORAL | Status: DC
Start: ? — End: 2019-06-27

## 2019-06-27 MED ORDER — CHLORTHALIDONE 25 MG PO TABS
25.00 | ORAL_TABLET | ORAL | Status: DC
Start: 2019-06-27 — End: 2019-06-27

## 2019-06-27 MED ORDER — DIPHENHYDRAMINE HCL 50 MG PO CAPS
50.00 | ORAL_CAPSULE | ORAL | Status: DC
Start: ? — End: 2019-06-27

## 2019-06-27 MED ORDER — ASPIRIN 81 MG PO CHEW
81.00 | CHEWABLE_TABLET | ORAL | Status: DC
Start: 2019-06-28 — End: 2019-06-27

## 2019-06-27 MED ORDER — CARVEDILOL 12.5 MG PO TABS
12.50 | ORAL_TABLET | ORAL | Status: DC
Start: 2019-06-27 — End: 2019-06-27

## 2019-06-27 MED ORDER — PANTOPRAZOLE SODIUM 40 MG PO TBEC
40.00 | DELAYED_RELEASE_TABLET | ORAL | Status: DC
Start: 2019-06-28 — End: 2019-06-27

## 2019-06-27 MED ORDER — METFORMIN HCL 500 MG PO TABS
1000.00 | ORAL_TABLET | ORAL | Status: DC
Start: 2019-06-27 — End: 2019-06-27

## 2019-06-27 MED ORDER — ARIPIPRAZOLE 5 MG PO TABS
5.00 | ORAL_TABLET | ORAL | Status: DC
Start: 2019-06-28 — End: 2019-06-27

## 2019-06-27 MED ORDER — GUAIFENESIN 100 MG/5ML PO SYRP
200.00 | ORAL_SOLUTION | ORAL | Status: DC
Start: ? — End: 2019-06-27

## 2019-06-27 MED ORDER — LISINOPRIL 40 MG PO TABS
40.00 | ORAL_TABLET | ORAL | Status: DC
Start: 2019-06-28 — End: 2019-06-27

## 2019-06-27 MED ORDER — INSULIN GLARGINE 100 UNIT/ML ~~LOC~~ SOLN
30.00 | SUBCUTANEOUS | Status: DC
Start: 2019-06-27 — End: 2019-06-27

## 2019-07-26 ENCOUNTER — Other Ambulatory Visit: Payer: Self-pay | Admitting: Orthopaedic Surgery

## 2019-07-26 MED ORDER — OXYCODONE-ACETAMINOPHEN 7.5-325 MG PO TABS: ORAL_TABLET | ORAL | 0 refills | Status: DC

## 2019-08-23 ENCOUNTER — Other Ambulatory Visit: Payer: Self-pay | Admitting: Orthopaedic Surgery

## 2019-08-23 MED ORDER — OXYCODONE-ACETAMINOPHEN 7.5-325 MG PO TABS: ORAL_TABLET | ORAL | 0 refills | Status: DC

## 2019-09-12 ENCOUNTER — Encounter: Payer: Self-pay | Admitting: Orthopaedic Surgery

## 2019-09-12 ENCOUNTER — Ambulatory Visit (INDEPENDENT_AMBULATORY_CARE_PROVIDER_SITE_OTHER): Payer: Medicare Other | Admitting: Orthopaedic Surgery

## 2019-09-12 ENCOUNTER — Other Ambulatory Visit: Payer: Self-pay

## 2019-09-12 VITALS — BP 155/85 | HR 82 | Ht 72.0 in | Wt >= 6400 oz

## 2019-09-12 DIAGNOSIS — Z6841 Body Mass Index (BMI) 40.0 and over, adult: Secondary | ICD-10-CM

## 2019-09-12 DIAGNOSIS — G8929 Other chronic pain: Secondary | ICD-10-CM | POA: Diagnosis not present

## 2019-09-12 DIAGNOSIS — M545 Low back pain, unspecified: Secondary | ICD-10-CM

## 2019-09-12 NOTE — Progress Notes (Signed)
Patient DD:UKGURK Nathaniel Velasquez, male DOB:11-01-1980, 39 y.o. YHC:[id number]  Chief Complaint  Patient presents with  . Back Pain    HPI  Nathaniel Velasquez is a 39 y.o. male who continues to have lower back pain.  He has no trauma.  He has good and bad days.  He is taking his medicine.  He has no weakness.   Body mass index is 81.37 kg/m.  The patient meets the AMA guidelines for Morbid (severe) obesity with a BMI > 40.0 and I have recommended weight loss.   ROS  Review of Systems  Constitutional: Positive for activity change.  HENT: Negative for congestion.   Respiratory: Negative for shortness of breath.   Cardiovascular: Negative for chest pain.  Endocrine: Positive for cold intolerance.  Musculoskeletal: Positive for arthralgias, back pain, gait problem and myalgias.  Allergic/Immunologic: Positive for environmental allergies.  All other systems reviewed and are negative.   All other systems reviewed and are negative.  The following is a summary of the past history medically, past history surgically, known current medicines, social history and family history.  This information is gathered electronically by the computer from prior information and documentation.  I review this each visit and have found including this information at this point in the chart is beneficial and informative.    Past Medical History:  Diagnosis Date  . Hypertension   . Prediabetic coma     Past Surgical History:  Procedure Laterality Date  . TONSILLECTOMY      Family History  Problem Relation Age of Onset  . Diabetes Mother   . Heart failure Mother     Social History Social History   Tobacco Use  . Smoking status: Never Smoker  . Smokeless tobacco: Never Used  Substance Use Topics  . Alcohol use: No  . Drug use: No    Allergies  Allergen Reactions  . Clindamycin     Hives, anaphylaxis    Current Outpatient Medications  Medication Sig Dispense Refill  . ARIPiprazole (ABILIFY) 5 MG  tablet Take by mouth.    Marland Kitchen aspirin EC 81 MG tablet Take 1 tablet (81 mg total) by mouth daily.    Marland Kitchen augmented betamethasone dipropionate (DIPROLENE-AF) 0.05 % cream   2  . carvedilol (COREG) 6.25 MG tablet Take 12.5 mg by mouth 2 (two) times daily with a meal.     . chlorthalidone (HYGROTON) 25 MG tablet Take 25 mg by mouth at bedtime.    . empagliflozin (JARDIANCE) 10 MG TABS tablet Take by mouth.    . EPINEPHrine 0.3 mg/0.3 mL IJ SOAJ injection Inject into the muscle.    . esomeprazole (NEXIUM) 40 MG capsule Take 40 mg by mouth 2 (two) times daily before a meal.     . famotidine (PEPCID) 20 MG tablet Take 20 mg by mouth daily.     . ferrous sulfate 325 (65 FE) MG tablet TK 1 T PO QD    . fluticasone (FLONASE) 50 MCG/ACT nasal spray Place 1 spray into both nostrils daily.  5  . lisinopril (PRINIVIL,ZESTRIL) 30 MG tablet Take 40 mg by mouth daily.   11  . metFORMIN (GLUCOPHAGE) 500 MG tablet Take 1,000 mg by mouth 2 (two) times daily.   1  . oxyCODONE-acetaminophen (PERCOCET) 7.5-325 MG tablet One tablet every four hours as need for pain. 130 tablet 0  . triamcinolone cream (KENALOG) 0.1 %     . atorvastatin (LIPITOR) 40 MG tablet Take 1 tablet (40 mg total)  by mouth daily. 30 tablet 0  . sitaGLIPtin (JANUVIA) 25 MG tablet Take 25 mg by mouth daily.     No current facility-administered medications for this visit.     Physical Exam  Blood pressure (!) 155/85, pulse 82, height 6' (1.829 m), weight (!) 600 lb (272.2 kg).  Constitutional: overall normal hygiene, normal nutrition, well developed, normal grooming, normal body habitus. Assistive device:none  Musculoskeletal: gait and station Limp none, muscle tone and strength are normal, no tremors or atrophy is present.  .  Neurological: coordination overall normal.  Deep tendon reflex/nerve stretch intact.  Sensation normal.  Cranial nerves II-XII intact.   Skin:   Normal overall no scars, lesions, ulcers or rashes. No  psoriasis.  Psychiatric: Alert and oriented x 3.  Recent memory intact, remote memory unclear.  Normal mood and affect. Well groomed.  Good eye contact.  Cardiovascular: overall no swelling, no varicosities, no edema bilaterally, normal temperatures of the legs and arms, no clubbing, cyanosis and good capillary refill.  Lymphatic: palpation is normal.  Spine/Pelvis examination:  Inspection:  Overall, sacoiliac joint benign and hips nontender; without crepitus or defects.   Thoracic spine inspection: Alignment normal without kyphosis present   Lumbar spine inspection:  Alignment  with normal lumbar lordosis, without scoliosis apparent.   Thoracic spine palpation:  without tenderness of spinal processes   Lumbar spine palpation: without tenderness of lumbar area; without tightness of lumbar muscles    Range of Motion:   Lumbar flexion, forward flexion is normal without pain or tenderness    Lumbar extension is full without pain or tenderness   Left lateral bend is normal without pain or tenderness   Right lateral bend is normal without pain or tenderness   Straight leg raising is normal  Strength & tone: normal   Stability overall normal stability  All other systems reviewed and are negative   The patient has been educated about the nature of the problem(s) and counseled on treatment options.  The patient appeared to understand what I have discussed and is in agreement with it.  Encounter Diagnoses  Name Primary?  . 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 Body mass index 70 and over, adult ([facility])   . Morbid obesity ([place])     PLAN Call if any problems.  Precautions discussed.  Continue current medications.   Return to clinic 3 months   Electronically Signed Sanjuana Kava, MD 6/8/20218:08 AM

## 2019-09-21 ENCOUNTER — Other Ambulatory Visit: Payer: Self-pay

## 2019-09-21 MED ORDER — OXYCODONE-ACETAMINOPHEN 7.5-325 MG PO TABS: ORAL_TABLET | ORAL | 0 refills | Status: DC

## 2019-10-19 ENCOUNTER — Other Ambulatory Visit: Payer: Self-pay | Admitting: Orthopaedic Surgery

## 2019-10-19 MED ORDER — OXYCODONE-ACETAMINOPHEN 7.5-325 MG PO TABS: ORAL_TABLET | ORAL | 0 refills | Status: DC

## 2019-11-16 ENCOUNTER — Other Ambulatory Visit: Payer: Self-pay | Admitting: Orthopaedic Surgery

## 2019-11-16 MED ORDER — OXYCODONE-ACETAMINOPHEN 7.5-325 MG PO TABS: ORAL_TABLET | ORAL | 0 refills | Status: DC

## 2019-12-12 ENCOUNTER — Other Ambulatory Visit: Payer: Self-pay

## 2019-12-12 ENCOUNTER — Encounter: Payer: Self-pay | Admitting: Orthopaedic Surgery

## 2019-12-12 ENCOUNTER — Ambulatory Visit (INDEPENDENT_AMBULATORY_CARE_PROVIDER_SITE_OTHER): Payer: Medicare Other | Admitting: Orthopaedic Surgery

## 2019-12-12 VITALS — BP 141/88 | HR 79 | Ht 72.0 in | Wt >= 6400 oz

## 2019-12-12 DIAGNOSIS — Z6841 Body Mass Index (BMI) 40.0 and over, adult: Secondary | ICD-10-CM

## 2019-12-12 DIAGNOSIS — M545 Low back pain, unspecified: Secondary | ICD-10-CM

## 2019-12-12 DIAGNOSIS — G8929 Other chronic pain: Secondary | ICD-10-CM | POA: Diagnosis not present

## 2019-12-12 MED ORDER — OXYCODONE-ACETAMINOPHEN 7.5-325 MG PO TABS: ORAL_TABLET | ORAL | 0 refills | Status: DC

## 2019-12-12 NOTE — Progress Notes (Signed)
Patient Nathaniel Velasquez, male DOB:[birth year], 39 y.o. VQM:[id number]  Chief Complaint  Patient presents with  . Back Pain    HPI  Nathaniel Velasquez is a 39 y.o. male who has chronic lower back pain.  He has good and bad days.  He is taking his medicine.  He has developed gallbladder problems and also chest problems.  He is to see cardiologist this week.  He has no new trauma, no weakness.   Body mass index is 81.37 kg/m.  The patient meets the AMA guidelines for Morbid (severe) obesity with a BMI > 40.0 and I have recommended weight loss.   ROS  Review of Systems  Constitutional: Positive for activity change.  HENT: Negative for congestion.   Respiratory: Negative for shortness of breath.   Cardiovascular: Negative for chest pain.  Endocrine: Positive for cold intolerance.  Musculoskeletal: Positive for arthralgias, back pain, gait problem and myalgias.  Allergic/Immunologic: Positive for environmental allergies.  All other systems reviewed and are negative.   All other systems reviewed and are negative.  The following is a summary of the past history medically, past history surgically, known current medicines, social history and family history.  This information is gathered electronically by the computer from prior information and documentation.  I review this each visit and have found including this information at this point in the chart is beneficial and informative.    Past Medical History:  Diagnosis Date  . Hypertension   . Prediabetic coma     Past Surgical History:  Procedure Laterality Date  . TONSILLECTOMY      Family History  Problem Relation Age of Onset  . Diabetes Mother   . Heart failure Mother     Social History Social History   Tobacco Use  . Smoking status: Never Smoker  . Smokeless tobacco: Never Used  Vaping Use  . Vaping Use: Never used  Substance Use Topics  . Alcohol use: No  . Drug use: No    Allergies  Allergen Reactions  .  Clindamycin     Hives, anaphylaxis    Current Outpatient Medications  Medication Sig Dispense Refill  . ARIPiprazole (ABILIFY) 5 MG tablet Take by mouth.    Marland Kitchen aspirin EC 81 MG tablet Take 1 tablet (81 mg total) by mouth daily.    Marland Kitchen augmented betamethasone dipropionate (DIPROLENE-AF) 0.05 % cream   2  . carvedilol (COREG) 6.25 MG tablet Take 12.5 mg by mouth 2 (two) times daily with a meal.     . chlorthalidone (HYGROTON) 25 MG tablet Take 25 mg by mouth at bedtime.    . empagliflozin (JARDIANCE) 10 MG TABS tablet Take by mouth.    . EPINEPHrine 0.3 mg/0.3 mL IJ SOAJ injection Inject into the muscle.    . esomeprazole (NEXIUM) 40 MG capsule Take 40 mg by mouth 2 (two) times daily before a meal.     . famotidine (PEPCID) 20 MG tablet Take 20 mg by mouth daily.     . ferrous sulfate 325 (65 FE) MG tablet TK 1 T PO QD    . fluticasone (FLONASE) 50 MCG/ACT nasal spray Place 1 spray into both nostrils daily.  5  . lisinopril (PRINIVIL,ZESTRIL) 30 MG tablet Take 40 mg by mouth daily.   11  . metFORMIN (GLUCOPHAGE) 500 MG tablet Take 1,000 mg by mouth 2 (two) times daily.   1  . oxyCODONE-acetaminophen (PERCOCET) 7.5-325 MG tablet One tablet every four hours as need for pain. 130 tablet  0  . triamcinolone cream (KENALOG) 0.1 %     . atorvastatin (LIPITOR) 40 MG tablet Take 1 tablet (40 mg total) by mouth daily. 30 tablet 0  . sitaGLIPtin (JANUVIA) 25 MG tablet Take 25 mg by mouth daily.     No current facility-administered medications for this visit.     Physical Exam  Blood pressure (!) 141/88, pulse 79, height 6' (1.829 m), weight (!) 600 lb (272.2 kg).  Constitutional: overall normal hygiene, normal nutrition, well developed, normal grooming, normal body habitus. Assistive device:none  Musculoskeletal: gait and station Limp none, muscle tone and strength are normal, no tremors or atrophy is present.  .  Neurological: coordination overall normal.  Deep tendon reflex/nerve stretch  intact.  Sensation normal.  Cranial nerves II-XII intact.   Skin:   Normal overall no scars, lesions, ulcers or rashes. No psoriasis.  Psychiatric: Alert and oriented x 3.  Recent memory intact, remote memory unclear.  Normal mood and affect. Well groomed.  Good eye contact.  Cardiovascular: overall no swelling, no varicosities, no edema bilaterally, normal temperatures of the legs and arms, no clubbing, cyanosis and good capillary refill.  Spine/Pelvis examination:  Inspection:  Overall, sacoiliac joint benign and hips nontender; without crepitus or defects.   Thoracic spine inspection: Alignment normal without kyphosis present   Lumbar spine inspection:  Alignment  with normal lumbar lordosis, without scoliosis apparent.   Thoracic spine palpation:  without tenderness of spinal processes   Lumbar spine palpation: without tenderness of lumbar area; without tightness of lumbar muscles    Range of Motion:   Lumbar flexion, forward flexion is normal without pain or tenderness    Lumbar extension is full without pain or tenderness   Left lateral bend is normal without pain or tenderness   Right lateral bend is normal without pain or tenderness   Straight leg raising is normal  Strength & tone: normal   Stability overall normal stability  Lymphatic: palpation is normal.  All other systems reviewed and are negative   The patient has been educated about the nature of the problem(s) and counseled on treatment options.  The patient appeared to understand what I have discussed and is in agreement with it.  Encounter Diagnoses  Name Primary?  . 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 Body mass index 70 and over, adult (HCC)   . Morbid obesity (HCC)     PLAN Call if any problems.  Precautions discussed.  Continue current medications.   Return to clinic 3 months   I have reviewed the [facility] Controlled Substance Reporting System web site prior to prescribing  narcotic medicine for this patient.   Electronically Signed Darreld Mclean, MD 9/7/20218:39 AM

## 2020-01-08 ENCOUNTER — Other Ambulatory Visit: Payer: Self-pay | Admitting: Orthopaedic Surgery

## 2020-01-09 ENCOUNTER — Ambulatory Visit: Payer: Medicare Other | Admitting: Orthopaedic Surgery

## 2020-01-09 MED ORDER — OXYCODONE-ACETAMINOPHEN 7.5-325 MG PO TABS: ORAL_TABLET | ORAL | 0 refills | Status: DC

## 2020-02-01 ENCOUNTER — Other Ambulatory Visit: Payer: Self-pay | Admitting: Orthopaedic Surgery

## 2020-02-01 MED ORDER — OXYCODONE-ACETAMINOPHEN 7.5-325 MG PO TABS: ORAL_TABLET | ORAL | 0 refills | Status: DC

## 2020-02-27 ENCOUNTER — Other Ambulatory Visit: Payer: Self-pay | Admitting: Orthopaedic Surgery

## 2020-02-28 ENCOUNTER — Encounter: Payer: Self-pay | Admitting: Orthopaedic Surgery

## 2020-02-28 MED ORDER — OXYCODONE-ACETAMINOPHEN 7.5-325 MG PO TABS: 1.0000 | ORAL_TABLET | Freq: Four times a day (QID) | ORAL | 0 refills | Status: AC | PRN

## 2020-03-05 ENCOUNTER — Encounter (HOSPITAL_BASED_OUTPATIENT_CLINIC_OR_DEPARTMENT_OTHER): Payer: Medicare Other | Admitting: Internal Medicine

## 2020-03-06 ENCOUNTER — Telehealth: Payer: Self-pay | Admitting: Orthopaedic Surgery

## 2020-03-06 MED ORDER — OXYCODONE-ACETAMINOPHEN 7.5-325 MG PO TABS
1.0000 | ORAL_TABLET | Freq: Four times a day (QID) | ORAL | 0 refills | Status: DC | PRN
Start: 2020-03-06 — End: 2020-04-02

## 2020-03-06 NOTE — Telephone Encounter (Signed)
Patient requests refill on Oxycodone/Acetaminophen 7.5-325  Mgs.  Qty  130  Sig: One tablet every four hours as need for pain.  [facility] [address].

## 2020-03-07 ENCOUNTER — Ambulatory Visit: Payer: Medicare Other | Admitting: Orthopaedic Surgery

## 2020-03-12 ENCOUNTER — Ambulatory Visit: Payer: Medicare Other | Admitting: Orthopaedic Surgery

## 2020-04-02 ENCOUNTER — Other Ambulatory Visit: Payer: Self-pay | Admitting: Orthopaedic Surgery

## 2020-04-03 MED ORDER — OXYCODONE-ACETAMINOPHEN 7.5-325 MG PO TABS: 1.0000 | ORAL_TABLET | Freq: Four times a day (QID) | ORAL | 0 refills | Status: DC | PRN

## 2020-05-01 ENCOUNTER — Other Ambulatory Visit: Payer: Self-pay

## 2020-05-01 MED ORDER — OXYCODONE-ACETAMINOPHEN 7.5-325 MG PO TABS: 1.0000 | ORAL_TABLET | Freq: Four times a day (QID) | ORAL | 0 refills | Status: AC | PRN

## 2020-05-07 ENCOUNTER — Ambulatory Visit: Payer: Medicare Other | Admitting: Orthopaedic Surgery

## 2020-05-14 ENCOUNTER — Encounter: Payer: Self-pay | Admitting: Orthopaedic Surgery

## 2020-05-14 ENCOUNTER — Other Ambulatory Visit: Payer: Self-pay

## 2020-05-14 ENCOUNTER — Ambulatory Visit (INDEPENDENT_AMBULATORY_CARE_PROVIDER_SITE_OTHER): Payer: Medicare Other | Admitting: Orthopaedic Surgery

## 2020-05-14 VITALS — BP 145/86 | HR 74

## 2020-05-14 DIAGNOSIS — Z6841 Body Mass Index (BMI) 40.0 and over, adult: Secondary | ICD-10-CM | POA: Diagnosis not present

## 2020-05-14 DIAGNOSIS — M545 Low back pain, unspecified: Secondary | ICD-10-CM

## 2020-05-14 DIAGNOSIS — G8929 Other chronic pain: Secondary | ICD-10-CM | POA: Diagnosis not present

## 2020-05-14 NOTE — Progress Notes (Signed)
Patient Nathaniel Velasquez, male DOB:[birth year], 40 y.o. VWU:[id number]  Chief Complaint  Patient presents with  . Back Pain    F/u LBP    HPI  Nathaniel Velasquez is a 40 y.o. male who has chronic lower back pain.  He has no new trauma.  Cold weather makes him worse.  He is taking his medicine.  He is going to PT at [facility].  There is no height or weight on file to calculate BMI.  ROS  Review of Systems  All other systems reviewed and are negative.  The following is a summary of the past history medically, past history surgically, known current medicines, social history and family history.  This information is gathered electronically by the computer from prior information and documentation.  I review this each visit and have found including this information at this point in the chart is beneficial and informative.    Past Medical History:  Diagnosis Date  . Hypertension   . Prediabetic coma     Past Surgical History:  Procedure Laterality Date  . TONSILLECTOMY      Family History  Problem Relation Age of Onset  . Diabetes Mother   . Heart failure Mother     Social History Social History   Tobacco Use  . Smoking status: Never Smoker  . Smokeless tobacco: Never Used  Vaping Use  . Vaping Use: Never used  Substance Use Topics  . Alcohol use: No  . Drug use: No    Allergies  Allergen Reactions  . Clindamycin     Hives, anaphylaxis    Current Outpatient Medications  Medication Sig Dispense Refill  . ARIPiprazole (ABILIFY) 5 MG tablet Take by mouth.    Marland Kitchen aspirin EC 81 MG tablet Take 1 tablet (81 mg total) by mouth daily.    Marland Kitchen augmented betamethasone dipropionate (DIPROLENE-AF) 0.05 % cream   2  . carvedilol (COREG) 6.25 MG tablet Take 12.5 mg by mouth 2 (two) times daily with a meal.     . chlorthalidone (HYGROTON) 25 MG tablet Take 25 mg by mouth at bedtime.    . empagliflozin (JARDIANCE) 10 MG TABS tablet Take by mouth.    . EPINEPHrine 0.3 mg/0.3 mL IJ SOAJ  injection Inject into the muscle.    . esomeprazole (NEXIUM) 40 MG capsule Take 40 mg by mouth 2 (two) times daily before a meal.     . famotidine (PEPCID) 20 MG tablet Take 20 mg by mouth daily.     . ferrous sulfate 325 (65 FE) MG tablet TK 1 T PO QD    . fluticasone (FLONASE) 50 MCG/ACT nasal spray Place 1 spray into both nostrils daily.  5  . lisinopril (PRINIVIL,ZESTRIL) 30 MG tablet Take 40 mg by mouth daily.   11  . metFORMIN (GLUCOPHAGE) 500 MG tablet Take 1,000 mg by mouth 2 (two) times daily.   1  . oxyCODONE-acetaminophen (PERCOCET) 7.5-325 MG tablet Take 1 tablet by mouth every 6 (six) hours as needed for moderate pain or severe pain (Must last 30 days.). 130 tablet 0  . triamcinolone cream (KENALOG) 0.1 %     . atorvastatin (LIPITOR) 40 MG tablet Take 1 tablet (40 mg total) by mouth daily. 30 tablet 0  . sitaGLIPtin (JANUVIA) 25 MG tablet Take 25 mg by mouth daily.     No current facility-administered medications for this visit.     Physical Exam  There were no vitals taken for this visit.  Constitutional: overall normal  hygiene, normal nutrition, well developed, normal grooming, normal body habitus. Assistive device:none  Musculoskeletal: gait and station Limp waddling type gait, muscle tone and strength are normal, no tremors or atrophy is present.  .  Neurological: coordination overall normal.  Deep tendon reflex/nerve stretch intact.  Sensation normal.  Cranial nerves II-XII intact.   Skin:   Normal overall no scars, lesions, ulcers or rashes. No psoriasis.  Psychiatric: Alert and oriented x 3.  Recent memory intact, remote memory unclear.  Normal mood and affect. Well groomed.  Good eye contact.  Cardiovascular: overall no swelling, no varicosities, no edema bilaterally, normal temperatures of the legs and arms, no clubbing, cyanosis and good capillary refill.  Lymphatic: palpation is normal.  Spine/Pelvis examination:  Inspection:  Overall, sacoiliac joint  benign and hips nontender; without crepitus or defects.   Thoracic spine inspection: Alignment normal without kyphosis present   Lumbar spine inspection:  Alignment  with normal lumbar lordosis, without scoliosis apparent.   Thoracic spine palpation:  without tenderness of spinal processes   Lumbar spine palpation: without tenderness of lumbar area; without tightness of lumbar muscles    Range of Motion:   Lumbar flexion, forward flexion is normal without pain or tenderness    Lumbar extension is full without pain or tenderness   Left lateral bend is normal without pain or tenderness   Right lateral bend is normal without pain or tenderness   Straight leg raising is normal  Strength & tone: normal   Stability overall normal stability  All other systems reviewed and are negative   The patient has been educated about the nature of the problem(s) and counseled on treatment options.  The patient appeared to understand what I have discussed and is in agreement with it.  Encounter Diagnoses  Name Primary?  . 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 Body mass index 70 and over, adult (HCC)   . Morbid obesity (HCC)     PLAN Call if any problems.  Precautions discussed.  Continue current medications.   Return to clinic 3 months   Electronically Signed Darreld Mclean, MD 2/8/20228:14 AM

## 2020-06-01 DIAGNOSIS — L03116 Cellulitis of left lower limb: Secondary | ICD-10-CM | POA: Insufficient documentation

## 2020-06-04 ENCOUNTER — Telehealth: Payer: Self-pay | Admitting: Orthopaedic Surgery

## 2020-06-04 NOTE — Telephone Encounter (Signed)
Patient called for refill   oxyCODONE-acetaminophen (PERCOCET) 7.5-325 MG tablet    Pharmacy:  [facility] on [address]

## 2020-06-05 MED ORDER — OXYCODONE-ACETAMINOPHEN 7.5-325 MG PO TABS: 1.0000 | ORAL_TABLET | Freq: Four times a day (QID) | ORAL | 0 refills | Status: DC | PRN

## 2020-06-20 DIAGNOSIS — R7881 Bacteremia: Secondary | ICD-10-CM | POA: Insufficient documentation

## 2020-06-25 ENCOUNTER — Ambulatory Visit: Payer: Medicare Other | Admitting: Orthopaedic Surgery

## 2020-07-02 ENCOUNTER — Other Ambulatory Visit: Payer: Self-pay

## 2020-07-02 ENCOUNTER — Ambulatory Visit (INDEPENDENT_AMBULATORY_CARE_PROVIDER_SITE_OTHER): Payer: Medicare Other | Admitting: Orthopaedic Surgery

## 2020-07-02 ENCOUNTER — Encounter: Payer: Self-pay | Admitting: Orthopaedic Surgery

## 2020-07-02 VITALS — BP 166/96 | HR 98 | Ht 72.0 in | Wt >= 6400 oz

## 2020-07-02 DIAGNOSIS — Z6841 Body Mass Index (BMI) 40.0 and over, adult: Secondary | ICD-10-CM

## 2020-07-02 DIAGNOSIS — L03116 Cellulitis of left lower limb: Secondary | ICD-10-CM

## 2020-07-02 MED ORDER — OXYCODONE-ACETAMINOPHEN 7.5-325 MG PO TABS: 1.0000 | ORAL_TABLET | ORAL | 0 refills | Status: DC | PRN

## 2020-07-02 NOTE — Progress Notes (Signed)
I was at [facility] two times in the last few weeks.  He was hospitalized at [facility] twice this past month for cellulitis of the left lower extremity.  First time was a five day stay, he came out, got worse and was re-admitted for about 10 days.  I have reviewed his records from [facility].  They are extensive.  He was on Vancomycin.  He is now on Augmentin and doxycycline.  They want me to continue pain control.  He is better.  He has no fever or chills.  He has resolving swelling and skin changes left lower leg.  He has chronic edema.  He has no draining wounds.  NV intact.  Encounter Diagnoses  Name Primary?  . Cellulitis of left lower extremity Yes  . Body mass index 70 and over, adult (HCC)   . Morbid obesity (HCC)    I will renew his pain medicine when due.  I will call in today.  I have reviewed the [place] Controlled Substance Reporting System web site prior to prescribing narcotic medicine for this patient.   Return in two weeks.  Call if any problem.  Precautions discussed.   Electronically Signed Darreld Mclean, MD 3/29/20229:22 AM

## 2020-07-16 ENCOUNTER — Ambulatory Visit: Payer: Medicare Other | Admitting: Orthopaedic Surgery

## 2020-07-30 ENCOUNTER — Telehealth: Payer: Self-pay | Admitting: Orthopaedic Surgery

## 2020-07-31 MED ORDER — OXYCODONE-ACETAMINOPHEN 7.5-325 MG PO TABS: 1.0000 | ORAL_TABLET | ORAL | 0 refills | Status: DC | PRN

## 2020-08-13 ENCOUNTER — Ambulatory Visit: Payer: Medicare Other | Admitting: Orthopaedic Surgery

## 2020-08-15 ENCOUNTER — Other Ambulatory Visit: Payer: Self-pay

## 2020-08-15 ENCOUNTER — Encounter: Payer: Self-pay | Admitting: Orthopaedic Surgery

## 2020-08-15 ENCOUNTER — Ambulatory Visit (INDEPENDENT_AMBULATORY_CARE_PROVIDER_SITE_OTHER): Payer: Medicare Other | Admitting: Orthopaedic Surgery

## 2020-08-15 VITALS — BP 162/88 | HR 89 | Ht 72.0 in | Wt >= 6400 oz

## 2020-08-15 DIAGNOSIS — L03116 Cellulitis of left lower limb: Secondary | ICD-10-CM | POA: Diagnosis not present

## 2020-08-15 DIAGNOSIS — M545 Low back pain, unspecified: Secondary | ICD-10-CM | POA: Diagnosis not present

## 2020-08-15 DIAGNOSIS — Z6841 Body Mass Index (BMI) 40.0 and over, adult: Secondary | ICD-10-CM

## 2020-08-15 DIAGNOSIS — G8929 Other chronic pain: Secondary | ICD-10-CM

## 2020-08-15 MED ORDER — FEBUXOSTAT 40 MG PO TABS: ORAL_TABLET | ORAL | 5 refills | Status: DC

## 2020-08-15 NOTE — Progress Notes (Signed)
Patient [name] Nathaniel Velasquez, male DOB:Jul 06, 1980, 40 y.o. TDD:[id number]  Chief Complaint  Patient presents with  . Leg Pain    L/leg is still swollen some  . Knee Pain    L/knee is better and back     HPI  Nathaniel Velasquez is a 40 y.o. male who has resolving cellulitis of the left lower leg and ankle.  He has lymphedema now.  He is being treated elsewhere for this.  His back is painful at times.  He has good and bad days.  He has no new trauma, no weakness.   Body mass index is 84.53 kg/m.  The patient meets the AMA guidelines for Morbid (severe) obesity with a BMI > 40.0 and I have recommended weight loss.   ROS  Review of Systems  Constitutional: Positive for activity change.  HENT: Negative for congestion.   Respiratory: Negative for shortness of breath.   Cardiovascular: Negative for chest pain.  Endocrine: Positive for cold intolerance.  Musculoskeletal: Positive for arthralgias, back pain, gait problem and myalgias.  Allergic/Immunologic: Positive for environmental allergies.  All other systems reviewed and are negative.   All other systems reviewed and are negative.  The following is a summary of the past history medically, past history surgically, known current medicines, social history and family history.  This information is gathered electronically by the computer from prior information and documentation.  I review this each visit and have found including this information at this point in the chart is beneficial and informative.    Past Medical History:  Diagnosis Date  . Hypertension   . Prediabetic coma     Past Surgical History:  Procedure Laterality Date  . TONSILLECTOMY      Family History  Problem Relation Age of Onset  . Diabetes Mother   . Heart failure Mother     Social History Social History   Tobacco Use  . Smoking status: Never Smoker  . Smokeless tobacco: Never Used  Vaping Use  . Vaping Use: Never used  Substance Use Topics  .  Alcohol use: No  . Drug use: No    Allergies  Allergen Reactions  . Clindamycin     Hives, anaphylaxis    Current Outpatient Medications  Medication Sig Dispense Refill  . febuxostat (ULORIC) 40 MG tablet One daily for gout 30 tablet 5  . amoxicillin-clavulanate (AUGMENTIN) 875-125 MG tablet SMARTSIG:1 Tablet(s) By Mouth Every 12 Hours    . ARIPiprazole (ABILIFY) 5 MG tablet Take by mouth.    Marland Kitchen aspirin EC 81 MG tablet Take 1 tablet (81 mg total) by mouth daily.    Marland Kitchen atorvastatin (LIPITOR) 40 MG tablet Take 1 tablet (40 mg total) by mouth daily. 30 tablet 0  . augmented betamethasone dipropionate (DIPROLENE-AF) 0.05 % cream   2  . carvedilol (COREG) 6.25 MG tablet Take 12.5 mg by mouth 2 (two) times daily with a meal.     . chlorthalidone (HYGROTON) 25 MG tablet Take 25 mg by mouth at bedtime.    Marland Kitchen doxycycline (VIBRAMYCIN) 100 MG capsule Take 100 mg by mouth 2 (two) times daily.    . empagliflozin (JARDIANCE) 10 MG TABS tablet Take by mouth.    . EPINEPHrine 0.3 mg/0.3 mL IJ SOAJ injection Inject into the muscle.    . esomeprazole (NEXIUM) 40 MG capsule Take 40 mg by mouth 2 (two) times daily before a meal.     . famotidine (PEPCID) 20 MG tablet Take 20 mg by mouth daily.     Marland Kitchen  ferrous sulfate 325 (65 FE) MG tablet TK 1 T PO QD    . fluticasone (FLONASE) 50 MCG/ACT nasal spray Place 1 spray into both nostrils daily.  5  . liraglutide (VICTOZA) 18 MG/3ML SOPN Inject 1.8mg  once daily    . lisinopril (PRINIVIL,ZESTRIL) 30 MG tablet Take 40 mg by mouth daily.   11  . metFORMIN (GLUCOPHAGE) 500 MG tablet Take 1,000 mg by mouth 2 (two) times daily.   1  . naloxone (NARCAN) nasal spray 4 mg/0.1 mL One spray in either nostril once for known/suspected opioid overdose. May repeat every 2-3 minutes in alternating nostril til EMS arrives    . oxyCODONE-acetaminophen (PERCOCET) 7.5-325 MG tablet Take 1 tablet by mouth every 4 (four) hours as needed for moderate pain or severe pain (Must last 30  days.). 160 tablet 0  . triamcinolone cream (KENALOG) 0.1 %      No current facility-administered medications for this visit.     Physical Exam  Blood pressure (!) 162/88, pulse 89, height 6' (1.829 m), weight (!) 623 lb 4 oz (282.7 kg).  Constitutional: overall normal hygiene, normal nutrition, well developed, normal grooming, normal body habitus. Assistive device:none  Musculoskeletal: gait and station Limp left, muscle tone and strength are normal, no tremors or atrophy is present.  .  Neurological: coordination overall normal.  Deep tendon reflex/nerve stretch intact.  Sensation normal.  Cranial nerves II-XII intact.   Skin:   Normal overall no scars, lesions, ulcers or rashes. No psoriasis.  Psychiatric: Alert and oriented x 3.  Recent memory intact, remote memory unclear.  Normal mood and affect. Well groomed.  Good eye contact.  Cardiovascular: overall no swelling, no varicosities, no edema bilaterally, normal temperatures of the legs and arms, no clubbing, cyanosis and good capillary refill.  Lymphatic: palpation is normal.  Left lower leg with brawny edema.  NV intact.  Spine/Pelvis examination:  Inspection:  Overall, sacoiliac joint benign and hips nontender; without crepitus or defects.   Thoracic spine inspection: Alignment normal without kyphosis present   Lumbar spine inspection:  Alignment  with normal lumbar lordosis, without scoliosis apparent.   Thoracic spine palpation:  without tenderness of spinal processes   Lumbar spine palpation: without tenderness of lumbar area; without tightness of lumbar muscles    Range of Motion:   Lumbar flexion, forward flexion is normal without pain or tenderness    Lumbar extension is full without pain or tenderness   Left lateral bend is normal without pain or tenderness   Right lateral bend is normal without pain or tenderness   Straight leg raising is normal  Strength & tone: normal   Stability overall normal  stability  All other systems reviewed and are negative   The patient has been educated about the nature of the problem(s) and counseled on treatment options.  The patient appeared to understand what I have discussed and is in agreement with it.  Encounter Diagnoses  Name Primary?  . Cellulitis of left lower extremity Yes  . Chronic midline low back pain without sciatica   . Body mass index 70 and over, adult (HCC)   . Morbid obesity (HCC)     PLAN Call if any problems.  Precautions discussed.  Continue current medications. I will call in [place] for his gout.  Return to clinic 6 weeks   Electronically Signed Darreld Mclean, MD 5/12/20229:03 AM

## 2020-08-27 ENCOUNTER — Telehealth: Payer: Self-pay | Admitting: Orthopaedic Surgery

## 2020-08-27 MED ORDER — OXYCODONE-ACETAMINOPHEN 7.5-325 MG PO TABS: 1.0000 | ORAL_TABLET | ORAL | 0 refills | Status: DC | PRN

## 2020-09-26 ENCOUNTER — Ambulatory Visit (INDEPENDENT_AMBULATORY_CARE_PROVIDER_SITE_OTHER): Payer: Medicare Other | Admitting: Orthopaedic Surgery

## 2020-09-26 ENCOUNTER — Other Ambulatory Visit: Payer: Self-pay

## 2020-09-26 ENCOUNTER — Encounter: Payer: Self-pay | Admitting: Orthopaedic Surgery

## 2020-09-26 VITALS — BP 142/73 | HR 84 | Ht 72.0 in | Wt >= 6400 oz

## 2020-09-26 DIAGNOSIS — M25562 Pain in left knee: Secondary | ICD-10-CM

## 2020-09-26 DIAGNOSIS — G8929 Other chronic pain: Secondary | ICD-10-CM

## 2020-09-26 DIAGNOSIS — Z6841 Body Mass Index (BMI) 40.0 and over, adult: Secondary | ICD-10-CM

## 2020-09-26 DIAGNOSIS — L03116 Cellulitis of left lower limb: Secondary | ICD-10-CM

## 2020-09-26 DIAGNOSIS — M545 Low back pain, unspecified: Secondary | ICD-10-CM

## 2020-09-26 MED ORDER — OXYCODONE-ACETAMINOPHEN 7.5-325 MG PO TABS: 1.0000 | ORAL_TABLET | ORAL | 0 refills | Status: DC | PRN

## 2020-09-26 NOTE — Progress Notes (Signed)
My leg is better.  The cellulitis of the left lower leg has resolved.  He has brawny edema now but no redness, no ulceration, no new trauma.  I have told him if it gets different or has redness go back to [facility] for examination.  His lower back pain is present, comes and goes.  He has no new trauma, no weakness.  Spine/Pelvis examination:  Inspection:  Overall, sacoiliac joint benign and hips nontender; without crepitus or defects.   Thoracic spine inspection: Alignment normal without kyphosis present   Lumbar spine inspection:  Alignment  with normal lumbar lordosis, without scoliosis apparent.   Thoracic spine palpation:  without tenderness of spinal processes   Lumbar spine palpation: without tenderness of lumbar area; without tightness of lumbar muscles    Range of Motion:   Lumbar flexion, forward flexion is normal without pain or tenderness    Lumbar extension is full without pain or tenderness   Left lateral bend is normal without pain or tenderness   Right lateral bend is normal without pain or tenderness   Straight leg raising is normal  Strength & tone: normal   Stability overall normal stability  He is working on his weight problem.  Encounter Diagnoses  Name Primary?   Chronic midline low back pain without sciatica Yes   Cellulitis of left lower extremity    Body mass index 70 and over, adult [facility])    Morbid obesity (HCC)    Chronic pain of left knee    I will refill his pain medicine.  I have reviewed the [place] Controlled Substance Reporting System web site prior to prescribing narcotic medicine for this patient.  Return in three months.  Call if any problem.  Precautions discussed.  Electronically Signed Darreld Mclean, MD 6/23/20229:06 AM

## 2020-10-21 ENCOUNTER — Telehealth: Payer: Self-pay | Admitting: Orthopaedic Surgery

## 2020-10-22 MED ORDER — OXYCODONE-ACETAMINOPHEN 7.5-325 MG PO TABS: 1.0000 | ORAL_TABLET | ORAL | 0 refills | Status: DC | PRN

## 2020-11-17 ENCOUNTER — Telehealth: Payer: Self-pay | Admitting: Orthopaedic Surgery

## 2020-11-20 ENCOUNTER — Telehealth: Payer: Self-pay | Admitting: Orthopaedic Surgery

## 2020-11-20 MED ORDER — OXYCODONE-ACETAMINOPHEN 7.5-325 MG PO TABS: 1.0000 | ORAL_TABLET | ORAL | 0 refills | Status: AC | PRN

## 2020-11-20 NOTE — Telephone Encounter (Signed)
Patient requests refill on Oxycodone/Acetaminophen 7.5-325 mgs.  Qty 160  Sig: Take 1 tablet by mouth every 4 (four) hours as needed for moderate pain or severe pain (Must last 30 days.).  Patient states he uses [facility] on [address]

## 2020-12-26 ENCOUNTER — Ambulatory Visit (INDEPENDENT_AMBULATORY_CARE_PROVIDER_SITE_OTHER): Payer: Medicare Other | Admitting: Orthopaedic Surgery

## 2020-12-26 ENCOUNTER — Other Ambulatory Visit: Payer: Self-pay

## 2020-12-26 ENCOUNTER — Encounter: Payer: Self-pay | Admitting: Orthopaedic Surgery

## 2020-12-26 VITALS — BP 130/76 | HR 84 | Wt >= 6400 oz

## 2020-12-26 DIAGNOSIS — Z6841 Body Mass Index (BMI) 40.0 and over, adult: Secondary | ICD-10-CM | POA: Diagnosis not present

## 2020-12-26 DIAGNOSIS — M545 Low back pain, unspecified: Secondary | ICD-10-CM

## 2020-12-26 DIAGNOSIS — G8929 Other chronic pain: Secondary | ICD-10-CM | POA: Diagnosis not present

## 2020-12-26 MED ORDER — OXYCODONE-ACETAMINOPHEN 7.5-325 MG PO TABS: 1.0000 | ORAL_TABLET | Freq: Four times a day (QID) | ORAL | 0 refills | Status: DC | PRN

## 2020-12-26 NOTE — Progress Notes (Signed)
My back is about the same.  He has chronic lower back pain.  He has more bad days than good days.  He has no weakness, no new trauma.  He has lost 13 pounds down to 616.  Spine/Pelvis examination:  Inspection:  Overall, sacoiliac joint benign and hips nontender; without crepitus or defects.   Thoracic spine inspection: Alignment normal without kyphosis present   Lumbar spine inspection:  Alignment  with normal lumbar lordosis, without scoliosis apparent.   Thoracic spine palpation:  without tenderness of spinal processes   Lumbar spine palpation: without tenderness of lumbar area; without tightness of lumbar muscles    Range of Motion:   Lumbar flexion, forward flexion is normal without pain or tenderness    Lumbar extension is full without pain or tenderness   Left lateral bend is normal without pain or tenderness   Right lateral bend is normal without pain or tenderness   Straight leg raising is normal  Strength & tone: normal   Stability overall normal stability  Encounter Diagnoses  Name Primary?   Chronic midline low back pain without sciatica Yes   Morbid obesity (HCC)    Body mass index 70 and over, adult [facility])    I will renew pain medicine.  Return in three months.  Call if any problem.  Precautions discussed.  Electronically Signed Darreld Mclean, MD 9/22/20228:26 AM

## 2021-01-24 ENCOUNTER — Telehealth: Payer: Self-pay | Admitting: Orthopaedic Surgery

## 2021-01-27 MED ORDER — OXYCODONE-ACETAMINOPHEN 7.5-325 MG PO TABS: 1.0000 | ORAL_TABLET | Freq: Four times a day (QID) | ORAL | 0 refills | Status: AC | PRN

## 2021-02-05 ENCOUNTER — Telehealth: Payer: Self-pay | Admitting: Orthopaedic Surgery

## 2021-02-05 NOTE — Telephone Encounter (Signed)
Patient called to request refill: (aware of appointment in December) oxyCODONE-acetaminophen (PERCOCET) 7.5-325 MG tablet       [facility], [address], [place]

## 2021-02-06 ENCOUNTER — Telehealth: Payer: Self-pay | Admitting: Radiology

## 2021-02-06 MED ORDER — OXYCODONE-ACETAMINOPHEN 7.5-325 MG PO TABS: ORAL_TABLET | ORAL | 0 refills | Status: DC

## 2021-02-06 MED ORDER — OXYCODONE-ACETAMINOPHEN 7.5-325 MG PO TABS: 1.0000 | ORAL_TABLET | Freq: Four times a day (QID) | ORAL | 0 refills | Status: DC | PRN

## 2021-02-06 NOTE — Telephone Encounter (Signed)
Pharmacy faxed a note-  DIRECTIONS DON'T MAKE SENSE - IS PATIENT TAKING EVERY 6 HOURS?  THIS IS A 40 DAY SUPPLY.  NOT 30.  PLEASE CORRECT AND RESEND IF ITS 4 HOURS.

## 2021-02-06 NOTE — Addendum Note (Signed)
Addended by: Earnstine Regal on: 02/06/2021 10:59 AM   Modules accepted: Orders

## 2021-03-06 ENCOUNTER — Telehealth: Payer: Self-pay | Admitting: Orthopaedic Surgery

## 2021-03-06 MED ORDER — OXYCODONE-ACETAMINOPHEN 7.5-325 MG PO TABS: ORAL_TABLET | ORAL | 0 refills | Status: DC

## 2021-03-27 ENCOUNTER — Ambulatory Visit (INDEPENDENT_AMBULATORY_CARE_PROVIDER_SITE_OTHER): Payer: Medicare Other | Admitting: Orthopaedic Surgery

## 2021-03-27 ENCOUNTER — Other Ambulatory Visit: Payer: Self-pay

## 2021-03-27 ENCOUNTER — Encounter: Payer: Self-pay | Admitting: Orthopaedic Surgery

## 2021-03-27 VITALS — BP 167/96 | HR 87 | Ht 72.0 in | Wt >= 6400 oz

## 2021-03-27 DIAGNOSIS — M545 Low back pain, unspecified: Secondary | ICD-10-CM | POA: Diagnosis not present

## 2021-03-27 DIAGNOSIS — G8929 Other chronic pain: Secondary | ICD-10-CM

## 2021-03-27 DIAGNOSIS — Z6841 Body Mass Index (BMI) 40.0 and over, adult: Secondary | ICD-10-CM | POA: Diagnosis not present

## 2021-03-27 NOTE — Progress Notes (Signed)
My back hurts some.  The cooler weather makes his back hurt more.  He has no new trauma, no weakness, no numbness.  He is active.  He is taking his medicine.  He has lost a few pounds.  Spine/Pelvis examination:  Inspection:  Overall, sacoiliac joint benign and hips nontender; without crepitus or defects.   Thoracic spine inspection: Alignment normal without kyphosis present   Lumbar spine inspection:  Alignment  with normal lumbar lordosis, without scoliosis apparent.   Thoracic spine palpation:  without tenderness of spinal processes   Lumbar spine palpation: without tenderness of lumbar area; without tightness of lumbar muscles    Range of Motion:   Lumbar flexion, forward flexion is normal without pain or tenderness    Lumbar extension is full without pain or tenderness   Left lateral bend is normal without pain or tenderness   Right lateral bend is normal without pain or tenderness   Straight leg raising is normal  Strength & tone: normal   Stability overall normal stability Encounter Diagnoses  Name Primary?   Chronic midline low back pain without sciatica Yes   Morbid obesity (HCC)    Body mass index 70 and over, adult (HCC)    Return in three months.  Call if any problem.  Precautions discussed.  Electronically Signed Darreld Mclean, MD 12/22/20228:56 AM

## 2021-04-07 ENCOUNTER — Telehealth: Payer: Self-pay | Admitting: Orthopaedic Surgery

## 2021-04-08 MED ORDER — OXYCODONE-ACETAMINOPHEN 7.5-325 MG PO TABS: ORAL_TABLET | ORAL | 0 refills | Status: DC

## 2021-05-12 ENCOUNTER — Telehealth: Payer: Self-pay | Admitting: Orthopaedic Surgery

## 2021-05-13 MED ORDER — OXYCODONE-ACETAMINOPHEN 7.5-325 MG PO TABS: ORAL_TABLET | ORAL | 0 refills | Status: DC

## 2021-06-09 ENCOUNTER — Telehealth: Payer: Self-pay | Admitting: Orthopaedic Surgery

## 2021-06-10 MED ORDER — OXYCODONE-ACETAMINOPHEN 7.5-325 MG PO TABS: ORAL_TABLET | ORAL | 0 refills | Status: DC

## 2021-06-24 ENCOUNTER — Encounter: Payer: Self-pay | Admitting: Orthopaedic Surgery

## 2021-06-24 ENCOUNTER — Other Ambulatory Visit: Payer: Self-pay

## 2021-06-24 ENCOUNTER — Ambulatory Visit (INDEPENDENT_AMBULATORY_CARE_PROVIDER_SITE_OTHER): Payer: Medicare Other | Admitting: Orthopaedic Surgery

## 2021-06-24 VITALS — BP 161/91 | HR 85 | Ht 72.0 in | Wt >= 6400 oz

## 2021-06-24 DIAGNOSIS — Z6841 Body Mass Index (BMI) 40.0 and over, adult: Secondary | ICD-10-CM | POA: Diagnosis not present

## 2021-06-24 DIAGNOSIS — M545 Low back pain, unspecified: Secondary | ICD-10-CM

## 2021-06-24 DIAGNOSIS — G8929 Other chronic pain: Secondary | ICD-10-CM | POA: Diagnosis not present

## 2021-06-24 NOTE — Progress Notes (Signed)
My back hurts. ? ?He has had more back pain recently with the cold weather.  He has no trauma, no weakness, no paresthesias.  He has gained some weight.  He is at 607 pounds today. ? ?Spine/Pelvis examination: ? Inspection:  Overall, sacoiliac joint benign and hips nontender; without crepitus or defects. ? ? Thoracic spine inspection: Alignment normal without kyphosis present ? ? Lumbar spine inspection:  Alignment  with normal lumbar lordosis, without scoliosis apparent. ? ? Thoracic spine palpation:  without tenderness of spinal processes ? ? Lumbar spine palpation: without tenderness of lumbar area; without tightness of lumbar muscles  ? ? Range of Motion: ?  Lumbar flexion, forward flexion is normal without pain or tenderness  ?  Lumbar extension is full without pain or tenderness ?  Left lateral bend is normal without pain or tenderness ?  Right lateral bend is normal without pain or tenderness ?  Straight leg raising is normal ? Strength & tone: normal ? ? Stability overall normal stability ? ?Encounter Diagnoses  ?Name Primary?  ? Chronic midline low back pain without sciatica Yes  ? Morbid obesity (HCC)   ? Body mass index 70 and over, adult [facility])   ? ?Return in three months. ? ?Call if any problem. ? ?Precautions discussed. ? ?Electronically Signed ?Darreld Mclean, MD ?3/21/20239:19 AM ? ? ?

## 2021-07-08 ENCOUNTER — Telehealth: Payer: Self-pay | Admitting: Orthopaedic Surgery

## 2021-07-09 MED ORDER — OXYCODONE-ACETAMINOPHEN 7.5-325 MG PO TABS: ORAL_TABLET | ORAL | 0 refills | Status: DC

## 2021-07-09 NOTE — Telephone Encounter (Signed)
I cannot give him 160 oxycodone

## 2021-08-05 ENCOUNTER — Telehealth: Payer: Self-pay | Admitting: Orthopaedic Surgery

## 2021-08-05 MED ORDER — OXYCODONE-ACETAMINOPHEN 7.5-325 MG PO TABS: ORAL_TABLET | ORAL | 0 refills | Status: DC

## 2021-09-09 ENCOUNTER — Telehealth: Payer: Self-pay | Admitting: Orthopaedic Surgery

## 2021-09-09 MED ORDER — OXYCODONE-ACETAMINOPHEN 7.5-325 MG PO TABS: ORAL_TABLET | ORAL | 0 refills | Status: DC

## 2021-09-23 ENCOUNTER — Ambulatory Visit: Payer: Medicare Other | Admitting: Orthopaedic Surgery

## 2021-10-09 ENCOUNTER — Other Ambulatory Visit: Payer: Self-pay | Admitting: Orthopaedic Surgery

## 2021-10-09 MED ORDER — OXYCODONE-ACETAMINOPHEN 7.5-325 MG PO TABS: 1.0000 | ORAL_TABLET | Freq: Four times a day (QID) | ORAL | 0 refills | Status: AC | PRN

## 2021-10-21 ENCOUNTER — Encounter: Payer: Self-pay | Admitting: Orthopaedic Surgery

## 2021-10-21 ENCOUNTER — Ambulatory Visit (INDEPENDENT_AMBULATORY_CARE_PROVIDER_SITE_OTHER): Payer: Medicare Other | Admitting: Orthopaedic Surgery

## 2021-10-21 VITALS — BP 151/92 | HR 85 | Ht 72.0 in | Wt >= 6400 oz

## 2021-10-21 DIAGNOSIS — M545 Low back pain, unspecified: Secondary | ICD-10-CM | POA: Diagnosis not present

## 2021-10-21 DIAGNOSIS — G8929 Other chronic pain: Secondary | ICD-10-CM | POA: Diagnosis not present

## 2021-10-21 DIAGNOSIS — Z6841 Body Mass Index (BMI) 40.0 and over, adult: Secondary | ICD-10-CM | POA: Diagnosis not present

## 2021-10-21 MED ORDER — OXYCODONE-ACETAMINOPHEN 7.5-325 MG PO TABS: 1.0000 | ORAL_TABLET | Freq: Four times a day (QID) | ORAL | 0 refills | Status: DC | PRN

## 2021-10-21 NOTE — Progress Notes (Signed)
My back is a little better.  He has had problems getting a new mattress.  He is over 600 pounds, and he has had difficulty in getting a new bed.  He has arranged one.  Since he got the new mattress, his back is not hurting when he wakes up.  He has no new trauma, no weakness.  He has developed cellulitis of the right leg now and was treated for this recently.  Spine/Pelvis examination:  Inspection:  Overall, sacoiliac joint benign and hips nontender; without crepitus or defects.   Thoracic spine inspection: Alignment normal without kyphosis present   Lumbar spine inspection:  Alignment  with normal lumbar lordosis, without scoliosis apparent.   Thoracic spine palpation:  without tenderness of spinal processes   Lumbar spine palpation: without tenderness of lumbar area; without tightness of lumbar muscles    Range of Motion:   Lumbar flexion, forward flexion is normal without pain or tenderness    Lumbar extension is full without pain or tenderness   Left lateral bend is normal without pain or tenderness   Right lateral bend is normal without pain or tenderness   Straight leg raising is normal  Strength & tone: normal   Stability overall normal stability  Encounter Diagnoses  Name Primary?   Chronic midline low back pain without sciatica Yes   Morbid obesity (HCC)    Body mass index 70 and over, adult (HCC)    I will refill his pain medicine.  I have reviewed the [place] Controlled Substance Reporting System web site prior to prescribing narcotic medicine for this patient.  Return in three months.   Call if any problem.  Precautions discussed.  Electronically Signed Darreld Mclean, MD 7/18/20238:29 AM

## 2021-11-20 ENCOUNTER — Other Ambulatory Visit: Payer: Self-pay | Admitting: Orthopaedic Surgery

## 2021-11-21 MED ORDER — OXYCODONE-ACETAMINOPHEN 7.5-325 MG PO TABS: 1.0000 | ORAL_TABLET | Freq: Four times a day (QID) | ORAL | 0 refills | Status: AC | PRN

## 2021-11-21 NOTE — Telephone Encounter (Signed)
Patient called to check on his refill, evidently Dr. Hilda Lias had already left when it came in. He is asking if one of the other doctors could do a refill for at least a week until Dr. Hilda Lias comes back.    Oxycodone-Acetaminophen  7.5/325 MG   PATIENT USES [facility] ON [address]

## 2021-12-02 ENCOUNTER — Telehealth: Payer: Self-pay | Admitting: Orthopaedic Surgery

## 2021-12-02 MED ORDER — OXYCODONE-ACETAMINOPHEN 7.5-325 MG PO TABS: 1.0000 | ORAL_TABLET | Freq: Four times a day (QID) | ORAL | 0 refills | Status: DC | PRN

## 2021-12-02 NOTE — Telephone Encounter (Signed)
Patient called to relay that he did not pick up the most recent prescription for pain medication at [facility]. [address] - states it is still pending authorization. Patient is re-requesting through Dr Hilda Lias for his "regular medication and regular quantity"

## 2021-12-30 ENCOUNTER — Telehealth: Payer: Self-pay | Admitting: Orthopaedic Surgery

## 2021-12-30 MED ORDER — OXYCODONE-ACETAMINOPHEN 7.5-325 MG PO TABS: 1.0000 | ORAL_TABLET | Freq: Four times a day (QID) | ORAL | 0 refills | Status: DC | PRN

## 2022-01-21 ENCOUNTER — Ambulatory Visit: Payer: Medicare Other | Admitting: Orthopaedic Surgery

## 2022-01-21 ENCOUNTER — Encounter: Payer: Self-pay | Admitting: Orthopaedic Surgery

## 2022-01-29 ENCOUNTER — Telehealth: Payer: Self-pay | Admitting: Orthopaedic Surgery

## 2022-01-29 MED ORDER — OXYCODONE-ACETAMINOPHEN 7.5-325 MG PO TABS: 1.0000 | ORAL_TABLET | Freq: Four times a day (QID) | ORAL | 0 refills | Status: AC | PRN

## 2022-02-03 ENCOUNTER — Ambulatory Visit (INDEPENDENT_AMBULATORY_CARE_PROVIDER_SITE_OTHER): Payer: Medicare Other | Admitting: Orthopaedic Surgery

## 2022-02-03 ENCOUNTER — Encounter: Payer: Self-pay | Admitting: Orthopaedic Surgery

## 2022-02-03 VITALS — BP 141/73 | HR 72 | Ht 72.0 in | Wt >= 6400 oz

## 2022-02-03 DIAGNOSIS — M545 Low back pain, unspecified: Secondary | ICD-10-CM

## 2022-02-03 DIAGNOSIS — Z6841 Body Mass Index (BMI) 40.0 and over, adult: Secondary | ICD-10-CM | POA: Diagnosis not present

## 2022-02-03 DIAGNOSIS — G8929 Other chronic pain: Secondary | ICD-10-CM | POA: Diagnosis not present

## 2022-02-03 NOTE — Progress Notes (Signed)
I am losing weight.  He is now around 580, down from 618.  He is losing weight.  He has no new trauma.  Back is tender with good and bad days, no trauma, no weakness.  Spine/Pelvis examination:  Inspection:  Overall, sacoiliac joint benign and hips nontender; without crepitus or defects.   Thoracic spine inspection: Alignment normal without kyphosis present   Lumbar spine inspection:  Alignment  with normal lumbar lordosis, without scoliosis apparent.   Thoracic spine palpation:  without tenderness of spinal processes   Lumbar spine palpation: without tenderness of lumbar area; without tightness of lumbar muscles    Range of Motion:   Lumbar flexion, forward flexion is normal without pain or tenderness    Lumbar extension is full without pain or tenderness   Left lateral bend is normal without pain or tenderness   Right lateral bend is normal without pain or tenderness   Straight leg raising is normal  Strength & tone: normal   Stability overall normal stability  Encounter Diagnoses  Name Primary?   Chronic midline low back pain without sciatica Yes   Morbid obesity ([place])    Body mass index 70 and over, adult ([place])    I refilled his pain medicine last week.  Return in three months.  Call if any problem.  Precautions discussed.  Electronically Signed Sanjuana Kava, MD 10/31/20239:41 AM

## 2022-02-03 NOTE — Patient Instructions (Signed)
Dr.Keeling is here all day on Tuesdays, Wednesday mornings, and Thursday mornings. If you need anything such as a medication refill, please either call BEFORE the end of the day on WEDNESDAY or send a message through mychart. Your pharmacy can send a refill request for you. Calling by the end of the day on WEDNESDAYS allows us time to send Dr.Keeling the request and for him to respond before he leaves on Thursdays.  If Dr. Keeling is out of the office, we may send it to one of the other providers and they may not refill it for the same amount that your original prescription is for.     

## 2022-03-03 ENCOUNTER — Telehealth: Payer: Self-pay

## 2022-03-03 ENCOUNTER — Telehealth: Payer: Self-pay | Admitting: Orthopaedic Surgery

## 2022-03-03 ENCOUNTER — Encounter: Payer: Self-pay | Admitting: Orthopaedic Surgery

## 2022-03-03 MED ORDER — OXYCODONE-ACETAMINOPHEN 7.5-325 MG PO TABS: 1.0000 | ORAL_TABLET | Freq: Four times a day (QID) | ORAL | 0 refills | Status: DC | PRN

## 2022-03-03 NOTE — Telephone Encounter (Signed)
Patient presented to the office requesting a refill on his Oxycodone 7.5-325, quantity 10 to be called to [facility] on [address]  He stated that it's due 03/06/22.

## 2022-03-03 NOTE — Telephone Encounter (Signed)
Refilled by provider

## 2022-03-03 NOTE — Telephone Encounter (Signed)
Attempted to call patient to let him know his prescription was called in. Call unable to be completed at this time.

## 2022-04-02 ENCOUNTER — Other Ambulatory Visit: Payer: Self-pay | Admitting: Orthopaedic Surgery

## 2022-04-02 MED ORDER — OXYCODONE-ACETAMINOPHEN 7.5-325 MG PO TABS: 1.0000 | ORAL_TABLET | Freq: Four times a day (QID) | ORAL | 0 refills | Status: DC | PRN

## 2022-04-09 ENCOUNTER — Telehealth: Payer: Self-pay

## 2022-04-09 NOTE — Telephone Encounter (Signed)
Request sent to provider who will return to office Tuesday

## 2022-04-09 NOTE — Telephone Encounter (Signed)
0xycodone-Acetaminophen  7.5-325 MG  Qty 110 Tablets  PATIENT USES [facility] [address]

## 2022-04-13 MED ORDER — OXYCODONE-ACETAMINOPHEN 7.5-325 MG PO TABS: 1.0000 | ORAL_TABLET | Freq: Four times a day (QID) | ORAL | 0 refills | Status: AC | PRN

## 2022-05-06 ENCOUNTER — Encounter: Payer: Self-pay | Admitting: Orthopaedic Surgery

## 2022-05-06 ENCOUNTER — Ambulatory Visit (INDEPENDENT_AMBULATORY_CARE_PROVIDER_SITE_OTHER): Payer: 59 | Admitting: Orthopaedic Surgery

## 2022-05-06 DIAGNOSIS — G8929 Other chronic pain: Secondary | ICD-10-CM | POA: Diagnosis not present

## 2022-05-06 DIAGNOSIS — M545 Low back pain, unspecified: Secondary | ICD-10-CM

## 2022-05-06 DIAGNOSIS — Z6841 Body Mass Index (BMI) 40.0 and over, adult: Secondary | ICD-10-CM

## 2022-05-06 NOTE — Progress Notes (Signed)
My back is sore at times.  He has more back pain with cold weather.  He has no new trauma.  His weight is stable at 585.  Spine/Pelvis examination:  Inspection:  Overall, sacoiliac joint benign and hips nontender; without crepitus or defects.   Thoracic spine inspection: Alignment normal without kyphosis present   Lumbar spine inspection:  Alignment  with normal lumbar lordosis, without scoliosis apparent.   Thoracic spine palpation:  without tenderness of spinal processes   Lumbar spine palpation: without tenderness of lumbar area; without tightness of lumbar muscles    Range of Motion:   Lumbar flexion, forward flexion is normal without pain or tenderness    Lumbar extension is full without pain or tenderness   Left lateral bend is normal without pain or tenderness   Right lateral bend is normal without pain or tenderness   Straight leg raising is normal  Strength & tone: normal   Stability overall normal stability  Encounter Diagnoses  Name Primary?   Chronic midline low back pain without sciatica Yes   Morbid obesity ([place])    Body mass index 70 and over, adult [facility])    His pain medicine is for refill next week.  I will see him in three months.  Call if any problem.  Precautions discussed.  Electronically Signed Sanjuana Kava, MD 1/31/20249:44 AM

## 2022-05-06 NOTE — Patient Instructions (Signed)
Dr.Keeling is here all day on Tuesdays. He is here half a day on Wednesday mornings, and Thursday mornings. If you need anything such as a medication refill, please either call BEFORE the end of the day on [facility] or send a message through [place]. Your pharmacy can send a refill request for you. Calling by the end of the day on [facility] allows [place] time to send Dr.Keeling the request and for him to respond before he leaves on Thursdays.

## 2022-05-19 ENCOUNTER — Telehealth: Payer: Self-pay | Admitting: Orthopaedic Surgery

## 2022-05-19 ENCOUNTER — Encounter: Payer: Self-pay | Admitting: Orthopaedic Surgery

## 2022-05-19 MED ORDER — OXYCODONE-ACETAMINOPHEN 7.5-325 MG PO TABS: 1.0000 | ORAL_TABLET | ORAL | 0 refills | Status: DC | PRN

## 2022-05-19 NOTE — Telephone Encounter (Signed)
Patient presented to the office requesting a refill on Oxycodone 7.5 325 to be sent to [facility] on [address].

## 2022-06-04 ENCOUNTER — Encounter: Payer: Self-pay | Admitting: Radiology

## 2022-06-16 ENCOUNTER — Telehealth: Payer: Self-pay | Admitting: Orthopaedic Surgery

## 2022-06-16 MED ORDER — OXYCODONE-ACETAMINOPHEN 7.5-325 MG PO TABS: 1.0000 | ORAL_TABLET | ORAL | 0 refills | Status: DC | PRN

## 2022-07-14 ENCOUNTER — Ambulatory Visit (INDEPENDENT_AMBULATORY_CARE_PROVIDER_SITE_OTHER): Payer: 59 | Admitting: Orthopaedic Surgery

## 2022-07-14 ENCOUNTER — Encounter: Payer: Self-pay | Admitting: Orthopaedic Surgery

## 2022-07-14 VITALS — BP 122/74 | HR 81 | Ht 72.0 in | Wt >= 6400 oz

## 2022-07-14 DIAGNOSIS — M545 Low back pain, unspecified: Secondary | ICD-10-CM | POA: Diagnosis not present

## 2022-07-14 DIAGNOSIS — Z6841 Body Mass Index (BMI) 40.0 and over, adult: Secondary | ICD-10-CM | POA: Diagnosis not present

## 2022-07-14 DIAGNOSIS — G8929 Other chronic pain: Secondary | ICD-10-CM

## 2022-07-14 MED ORDER — OXYCODONE-ACETAMINOPHEN 7.5-325 MG PO TABS: 1.0000 | ORAL_TABLET | ORAL | 0 refills | Status: DC | PRN

## 2022-07-14 NOTE — Progress Notes (Signed)
I got COVID and was really sick.  He is recovering from COVID-19.  He had it a month ago and was very ill.  He is better now but has had breathing problems.  He has gained 15 pounds and is up to 600 pounds.  He has more back and joint pains.  He has no new trauma.  His back is tender but no weakness, no spasm.  ROM is good as expected due to his size.  He is limited in some motions.  NV intact.  Gait is waddling.  Encounter Diagnoses  Name Primary?   Chronic midline low back pain without sciatica Yes   Morbid obesity    Body mass index 70 and over, adult    I will renew pain medicine.  I have reviewed the [place] Controlled Substance Reporting System web site prior to prescribing narcotic medicine for this patient.  Keep regular appointment as scheduled.  Call if any problem.  Precautions discussed.  Electronically Signed Darreld Mclean, MD 4/9/20248:48 AM

## 2022-07-15 ENCOUNTER — Ambulatory Visit: Payer: Medicaid Other | Admitting: Orthopaedic Surgery

## 2022-08-04 ENCOUNTER — Ambulatory Visit: Payer: Medicaid Other | Admitting: Orthopaedic Surgery

## 2022-08-11 ENCOUNTER — Telehealth: Payer: Self-pay | Admitting: Orthopaedic Surgery

## 2022-08-11 MED ORDER — OXYCODONE-ACETAMINOPHEN 7.5-325 MG PO TABS: 1.0000 | ORAL_TABLET | ORAL | 0 refills | Status: DC | PRN

## 2022-09-15 ENCOUNTER — Telehealth: Payer: Self-pay | Admitting: Orthopaedic Surgery

## 2022-09-15 MED ORDER — OXYCODONE-ACETAMINOPHEN 7.5-325 MG PO TABS: 1.0000 | ORAL_TABLET | ORAL | 0 refills | Status: DC | PRN

## 2022-10-13 ENCOUNTER — Ambulatory Visit: Payer: Medicaid Other | Admitting: Orthopaedic Surgery

## 2022-10-13 ENCOUNTER — Telehealth: Payer: Self-pay | Admitting: Orthopaedic Surgery

## 2022-10-13 MED ORDER — OXYCODONE-ACETAMINOPHEN 7.5-325 MG PO TABS: 1.0000 | ORAL_TABLET | ORAL | 0 refills | Status: DC | PRN

## 2022-10-14 ENCOUNTER — Ambulatory Visit (INDEPENDENT_AMBULATORY_CARE_PROVIDER_SITE_OTHER): Payer: 59 | Admitting: Orthopaedic Surgery

## 2022-10-14 ENCOUNTER — Encounter: Payer: Self-pay | Admitting: Orthopaedic Surgery

## 2022-10-14 VITALS — Ht 72.0 in | Wt >= 6400 oz

## 2022-10-14 DIAGNOSIS — M545 Low back pain, unspecified: Secondary | ICD-10-CM | POA: Diagnosis not present

## 2022-10-14 DIAGNOSIS — Z6841 Body Mass Index (BMI) 40.0 and over, adult: Secondary | ICD-10-CM

## 2022-10-14 DIAGNOSIS — G8929 Other chronic pain: Secondary | ICD-10-CM | POA: Diagnosis not present

## 2022-10-14 MED ORDER — OXYCODONE-ACETAMINOPHEN 7.5-325 MG PO TABS: 1.0000 | ORAL_TABLET | ORAL | 0 refills | Status: DC | PRN

## 2022-10-14 NOTE — Progress Notes (Signed)
My back is about the same.  He has pain in the lower back that comes and goes.  He has been active.  He has no new trauma.  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  ROM is good considering his size.  NV intact.  He has normal muscle tone and strength.    Encounter Diagnoses  Name Primary?   Chronic midline low back pain without sciatica Yes   Morbid obesity (HCC)    Body mass index 70 and over, adult [facility])    I have reviewed the [place] Controlled Substance Reporting System web site prior to prescribing narcotic medicine for this patient.  Return in three months.  Call if any problem.  Precautions discussed.  Electronically Signed Darreld Mclean, MD 7/10/20249:37 AM

## 2022-11-10 ENCOUNTER — Telehealth: Payer: Self-pay | Admitting: Orthopaedic Surgery

## 2022-11-10 MED ORDER — OXYCODONE-ACETAMINOPHEN 7.5-325 MG PO TABS: 1.0000 | ORAL_TABLET | ORAL | 0 refills | Status: DC | PRN

## 2022-12-08 ENCOUNTER — Telehealth: Payer: Self-pay | Admitting: Orthopaedic Surgery

## 2022-12-09 MED ORDER — OXYCODONE-ACETAMINOPHEN 7.5-325 MG PO TABS: 1.0000 | ORAL_TABLET | ORAL | 0 refills | Status: DC | PRN

## 2023-01-13 ENCOUNTER — Encounter: Payer: Self-pay | Admitting: Orthopaedic Surgery

## 2023-01-13 ENCOUNTER — Ambulatory Visit (INDEPENDENT_AMBULATORY_CARE_PROVIDER_SITE_OTHER): Payer: 59 | Admitting: Orthopaedic Surgery

## 2023-01-13 VITALS — BP 123/72 | HR 78 | Ht 72.0 in | Wt >= 6400 oz

## 2023-01-13 DIAGNOSIS — G8929 Other chronic pain: Secondary | ICD-10-CM

## 2023-01-13 DIAGNOSIS — M545 Low back pain, unspecified: Secondary | ICD-10-CM

## 2023-01-13 DIAGNOSIS — Z6841 Body Mass Index (BMI) 40.0 and over, adult: Secondary | ICD-10-CM | POA: Diagnosis not present

## 2023-01-13 MED ORDER — OXYCODONE-ACETAMINOPHEN 7.5-325 MG PO TABS: 1.0000 | ORAL_TABLET | ORAL | 0 refills | Status: DC | PRN

## 2023-01-13 NOTE — Progress Notes (Signed)
I am doing fair  He has chronic lower back pain. He has no new trauma.  He is active.  He has good and bad days.    He has no lower back spasm, ROM is good despite his large size.  NV intact.  Muscle tone and strength normal.  Encounter Diagnoses  Name Primary?   Chronic midline low back pain without sciatica Yes   Morbid obesity (HCC)    Body mass index 70 and over, adult [facility])    I have reviewed the [place] Controlled Substance Reporting System web site prior to prescribing narcotic medicine for this patient.  Return in three months.  Call if any problem.  Precautions discussed.  Electronically Signed Darreld Mclean, MD 10/9/20248:57 AM

## 2023-02-09 ENCOUNTER — Other Ambulatory Visit: Payer: Self-pay | Admitting: Orthopedic Surgery

## 2023-02-09 ENCOUNTER — Other Ambulatory Visit: Payer: Self-pay | Admitting: Orthopaedic Surgery

## 2023-02-09 MED ORDER — OXYCODONE-ACETAMINOPHEN 7.5-325 MG PO TABS: 1.0000 | ORAL_TABLET | ORAL | 0 refills | Status: DC | PRN

## 2023-03-12 ENCOUNTER — Telehealth: Payer: Self-pay | Admitting: Orthopedic Surgery

## 2023-03-12 MED ORDER — OXYCODONE-ACETAMINOPHEN 7.5-325 MG PO TABS: 1.0000 | ORAL_TABLET | ORAL | 0 refills | Status: DC | PRN

## 2023-04-06 ENCOUNTER — Telehealth: Payer: Self-pay | Admitting: Orthopaedic Surgery

## 2023-04-08 MED ORDER — OXYCODONE-ACETAMINOPHEN 7.5-325 MG PO TABS: 1.0000 | ORAL_TABLET | ORAL | 0 refills | Status: DC | PRN

## 2023-04-14 ENCOUNTER — Ambulatory Visit: Payer: 59 | Admitting: Orthopaedic Surgery

## 2023-04-14 ENCOUNTER — Encounter: Payer: Self-pay | Admitting: Orthopaedic Surgery

## 2023-04-14 VITALS — BP 125/74 | HR 80 | Ht 72.0 in | Wt >= 6400 oz

## 2023-04-14 DIAGNOSIS — G8929 Other chronic pain: Secondary | ICD-10-CM | POA: Diagnosis not present

## 2023-04-14 DIAGNOSIS — Z6841 Body Mass Index (BMI) 40.0 and over, adult: Secondary | ICD-10-CM

## 2023-04-14 DIAGNOSIS — M545 Low back pain, unspecified: Secondary | ICD-10-CM | POA: Diagnosis not present

## 2023-04-14 NOTE — Progress Notes (Signed)
 My back is more sore today.  He has had more pain in the lower back he feels secondary to the cold weather.  He has no new trauma, no weakness.  His weight remains the same.  He has no weakness.  Lower back is tender, ROM decreased, gait is good and slow, NV intact.  He has no spasm.  Muscle tone and strength are normal.  Encounter Diagnoses  Name Primary?   Chronic midline low back pain without sciatica Yes   Morbid obesity (HCC)    Body mass index 70 and over, adult (HCC)    I refilled his pain medicine last week.  Return in three months.  Call if any problem.  Precautions discussed.  Electronically Signed Lemond Stable, MD 1/8/20258:45 AM;

## 2023-05-10 ENCOUNTER — Telehealth: Payer: Self-pay | Admitting: Orthopaedic Surgery

## 2023-05-11 MED ORDER — OXYCODONE-ACETAMINOPHEN 7.5-325 MG PO TABS: 1.0000 | ORAL_TABLET | ORAL | 0 refills | Status: DC | PRN

## 2023-06-07 ENCOUNTER — Telehealth: Payer: Self-pay | Admitting: Orthopaedic Surgery

## 2023-06-08 MED ORDER — OXYCODONE-ACETAMINOPHEN 7.5-325 MG PO TABS: 1.0000 | ORAL_TABLET | ORAL | 0 refills | Status: DC | PRN

## 2023-06-30 ENCOUNTER — Other Ambulatory Visit (INDEPENDENT_AMBULATORY_CARE_PROVIDER_SITE_OTHER)

## 2023-06-30 ENCOUNTER — Encounter: Payer: Self-pay | Admitting: Orthopaedic Surgery

## 2023-06-30 ENCOUNTER — Ambulatory Visit (INDEPENDENT_AMBULATORY_CARE_PROVIDER_SITE_OTHER): Admitting: Orthopaedic Surgery

## 2023-06-30 DIAGNOSIS — M79674 Pain in right toe(s): Secondary | ICD-10-CM | POA: Diagnosis not present

## 2023-06-30 NOTE — Progress Notes (Signed)
 My big toe is hurting.  He has had pain in the right great toe for several weeks.  It bothers him more at night.  He has had swelling, no redness.  He denies any trauma.  It cramps often.    Gout runs in the family.  I will get serum uric acid done.  Right great toe has no swelling but is tender.  NV intact. ROM is good.  Encounter Diagnosis  Name Primary?   Pain of right great toe Yes   Get the blood work.  Return in three weeks.  Call if any problem.  Precautions discussed.  Electronically Signed Darreld Mclean, MD 3/26/202510:00 AM

## 2023-07-01 ENCOUNTER — Telehealth: Payer: Self-pay | Admitting: Radiology

## 2023-07-01 LAB — URIC ACID: Uric Acid, Serum: 8.2 mg/dL — ABNORMAL HIGH (ref 4.0–8.0)

## 2023-07-01 NOTE — Telephone Encounter (Signed)
 I called him to see and he does use the [facility] on [address]  Looks like in 2022 you gave him Uloric so he may not be able to use the Allopurinol

## 2023-07-01 NOTE — Telephone Encounter (Signed)
-----   Message from [place] sent at 07/01/2023  2:28 PM EDT ----- He has gout.  Labs show it.  Confirms physical exam.  I need to call in allopurinol for him.  Please find out pharmacy and then send me a request.  Thanks. ----- Message ----- From: Janace Hoard Lab Results In Sent: 07/01/2023  12:37 AM EDT To: Darreld Mclean, MD

## 2023-07-02 MED ORDER — ALLOPURINOL 300 MG PO TABS
300.0000 mg | ORAL_TABLET | Freq: Every day | ORAL | 5 refills | Status: DC
Start: 2023-07-02 — End: 2023-07-21

## 2023-07-05 ENCOUNTER — Other Ambulatory Visit: Payer: Self-pay | Admitting: Orthopaedic Surgery

## 2023-07-06 ENCOUNTER — Encounter: Payer: Self-pay | Admitting: Orthopaedic Surgery

## 2023-07-06 MED ORDER — OXYCODONE-ACETAMINOPHEN 7.5-325 MG PO TABS: 1.0000 | ORAL_TABLET | ORAL | 0 refills | Status: AC | PRN

## 2023-07-07 ENCOUNTER — Other Ambulatory Visit: Payer: Self-pay | Admitting: Orthopedic Surgery

## 2023-07-07 NOTE — Telephone Encounter (Signed)
 I sent message to patient that the RX was sent for the highest quantity the other doctor can prescribe.

## 2023-07-14 ENCOUNTER — Ambulatory Visit: Payer: 59 | Admitting: Orthopaedic Surgery

## 2023-07-21 ENCOUNTER — Encounter: Payer: Self-pay | Admitting: Orthopaedic Surgery

## 2023-07-21 ENCOUNTER — Ambulatory Visit (INDEPENDENT_AMBULATORY_CARE_PROVIDER_SITE_OTHER): Admitting: Orthopaedic Surgery

## 2023-07-21 VITALS — BP 129/70 | HR 84 | Ht 72.0 in | Wt >= 6400 oz

## 2023-07-21 DIAGNOSIS — M1A0710 Idiopathic chronic gout, right ankle and foot, without tophus (tophi): Secondary | ICD-10-CM | POA: Diagnosis not present

## 2023-07-21 DIAGNOSIS — M79674 Pain in right toe(s): Secondary | ICD-10-CM

## 2023-07-21 DIAGNOSIS — Z6841 Body Mass Index (BMI) 40.0 and over, adult: Secondary | ICD-10-CM | POA: Diagnosis not present

## 2023-07-21 MED ORDER — OXYCODONE-ACETAMINOPHEN 7.5-325 MG PO TABS: 1.0000 | ORAL_TABLET | Freq: Four times a day (QID) | ORAL | 0 refills | Status: DC | PRN

## 2023-07-21 MED ORDER — FEBUXOSTAT 40 MG PO TABS: ORAL_TABLET | ORAL | 5 refills | Status: AC

## 2023-07-21 NOTE — Progress Notes (Signed)
 He could not tolerate the allopurinol.  His uric acid is 8.2, elevated.  I will change to Uloric 40 daily.  His gout pain is less in the foot.  NV intact.  I will refill his pain medicine.  I have reviewed the [place]  Controlled Substance Reporting System web site prior to prescribing narcotic medicine for this patient.  Encounter Diagnoses  Name Primary?   Pain of right great toe Yes   Chronic idiopathic gout involving toe of right foot without tophus    Morbid obesity (HCC)    Body mass index 70 and over, adult (HCC)    Return in one month.  Call if any problem.  Precautions discussed.  Electronically Signed Pleasant Brilliant, MD 4/16/20259:15 AM

## 2023-08-17 ENCOUNTER — Telehealth: Payer: Self-pay | Admitting: Orthopaedic Surgery

## 2023-08-18 MED ORDER — OXYCODONE-ACETAMINOPHEN 7.5-325 MG PO TABS: 1.0000 | ORAL_TABLET | Freq: Four times a day (QID) | ORAL | 0 refills | Status: DC | PRN

## 2023-09-15 ENCOUNTER — Encounter: Payer: Self-pay | Admitting: Orthopaedic Surgery

## 2023-09-16 MED ORDER — OXYCODONE-ACETAMINOPHEN 5-325 MG PO TABS: ORAL_TABLET | ORAL | 0 refills | Status: DC

## 2023-09-21 ENCOUNTER — Encounter: Payer: Self-pay | Admitting: Orthopaedic Surgery

## 2023-09-22 ENCOUNTER — Telehealth: Payer: Self-pay

## 2023-09-22 MED ORDER — OXYCODONE-ACETAMINOPHEN 7.5-325 MG PO TABS: 1.0000 | ORAL_TABLET | ORAL | 0 refills | Status: DC | PRN

## 2023-09-22 NOTE — Telephone Encounter (Signed)
 Patient stopped by the office to ask if you had meant to send in Oxycodone -Acetaminophen  5/325 MG instead of the normal 7.5/325 MG. Stated the 5/325 MG really doesn't help him. He stated he didn't pick up the 5/325 MG   Please advise

## 2023-10-05 ENCOUNTER — Emergency Department (HOSPITAL_COMMUNITY)

## 2023-10-05 ENCOUNTER — Emergency Department (HOSPITAL_COMMUNITY)
Admission: EM | Admit: 2023-10-05 | Discharge: 2023-10-05 | Disposition: A | Discharge status: Home / Self Care | Attending: Emergency Medicine | Admitting: Emergency Medicine

## 2023-10-05 DIAGNOSIS — M549 Dorsalgia, unspecified: Secondary | ICD-10-CM | POA: Insufficient documentation

## 2023-10-05 DIAGNOSIS — Z7982 Long term (current) use of aspirin: Secondary | ICD-10-CM | POA: Diagnosis not present

## 2023-10-05 DIAGNOSIS — M542 Cervicalgia: Secondary | ICD-10-CM | POA: Diagnosis present

## 2023-10-05 DIAGNOSIS — R109 Unspecified abdominal pain: Secondary | ICD-10-CM | POA: Insufficient documentation

## 2023-10-05 DIAGNOSIS — Y92410 Unspecified street and highway as the place of occurrence of the external cause: Secondary | ICD-10-CM | POA: Diagnosis not present

## 2023-10-05 DIAGNOSIS — Z794 Long term (current) use of insulin: Secondary | ICD-10-CM | POA: Insufficient documentation

## 2023-10-05 DIAGNOSIS — R079 Chest pain, unspecified: Secondary | ICD-10-CM | POA: Insufficient documentation

## 2023-10-05 DIAGNOSIS — M7918 Myalgia, other site: Secondary | ICD-10-CM

## 2023-10-05 MED ORDER — IBUPROFEN 600 MG PO TABS: 600.0000 mg | ORAL_TABLET | Freq: Four times a day (QID) | ORAL | 0 refills | Status: AC | PRN

## 2023-10-05 MED ORDER — METHOCARBAMOL 750 MG PO TABS: 750.0000 mg | ORAL_TABLET | Freq: Four times a day (QID) | ORAL | 0 refills | Status: AC

## 2023-10-05 MED ORDER — IBUPROFEN 200 MG PO TABS
600.0000 mg | ORAL_TABLET | Freq: Once | ORAL | Status: AC
  Administered 2023-10-05: 600 mg via ORAL
  Filled 2023-10-05: qty 3

## 2023-10-05 NOTE — Discharge Instructions (Addendum)
 As we discussed, you can expect to be more sore over the next 1-2 days, then see some improvement. Take the medications as prescribed. If you do not feel you are improving over the next 4-5 days, see your doctor for recheck and further evaluation.   Take your regularly prescribed Percocet, and add ibuprofen  and Robaxin  as prescribed.
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
## 2023-10-20 ENCOUNTER — Ambulatory Visit (INDEPENDENT_AMBULATORY_CARE_PROVIDER_SITE_OTHER): Admitting: Orthopaedic Surgery

## 2023-10-20 ENCOUNTER — Encounter: Payer: Self-pay | Admitting: Orthopaedic Surgery

## 2023-10-20 VITALS — BP 126/70 | HR 82 | Ht 72.0 in | Wt >= 6400 oz

## 2023-10-20 DIAGNOSIS — G8929 Other chronic pain: Secondary | ICD-10-CM

## 2023-10-20 DIAGNOSIS — Z6841 Body Mass Index (BMI) 40.0 and over, adult: Secondary | ICD-10-CM

## 2023-10-20 DIAGNOSIS — M1A0710 Idiopathic chronic gout, right ankle and foot, without tophus (tophi): Secondary | ICD-10-CM

## 2023-10-20 DIAGNOSIS — M545 Low back pain, unspecified: Secondary | ICD-10-CM

## 2023-10-20 MED ORDER — OXYCODONE-ACETAMINOPHEN 7.5-325 MG PO TABS: 1.0000 | ORAL_TABLET | ORAL | 0 refills | Status: DC | PRN
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
## 2023-11-16 ENCOUNTER — Encounter: Payer: Self-pay | Admitting: Orthopaedic Surgery

## 2023-11-17 MED ORDER — OXYCODONE-ACETAMINOPHEN 7.5-325 MG PO TABS: 1.0000 | ORAL_TABLET | ORAL | 0 refills | Status: DC | PRN

## 2023-11-24 ENCOUNTER — Ambulatory Visit: Admitting: Orthopaedic Surgery

## 2023-11-26 ENCOUNTER — Encounter: Payer: Self-pay | Admitting: Radiology

## 2023-12-14 ENCOUNTER — Telehealth: Payer: Self-pay | Admitting: Orthopaedic Surgery

## 2023-12-14 MED ORDER — OXYCODONE-ACETAMINOPHEN 7.5-325 MG PO TABS: 1.0000 | ORAL_TABLET | ORAL | 0 refills | Status: DC | PRN

## 2023-12-14 NOTE — Telephone Encounter (Signed)
 Dr. Janae pt - pt presented to the office and requested a refill for Oxycodone  7.5-325 to be sent [facility]

## 2024-01-18 ENCOUNTER — Ambulatory Visit (INDEPENDENT_AMBULATORY_CARE_PROVIDER_SITE_OTHER): Admitting: Orthopedic Surgery

## 2024-01-18 ENCOUNTER — Encounter: Payer: Self-pay | Admitting: Orthopedic Surgery

## 2024-01-18 DIAGNOSIS — G8929 Other chronic pain: Secondary | ICD-10-CM | POA: Diagnosis not present

## 2024-01-18 DIAGNOSIS — M545 Low back pain, unspecified: Secondary | ICD-10-CM

## 2024-01-18 MED ORDER — OXYCODONE-ACETAMINOPHEN 7.5-325 MG PO TABS: 1.0000 | ORAL_TABLET | ORAL | 0 refills | Status: AC | PRN
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
## 2024-02-07 ENCOUNTER — Encounter: Payer: Self-pay | Admitting: Radiology

## 2024-02-23 ENCOUNTER — Ambulatory Visit: Admitting: Orthopaedic Surgery

## 2024-02-23 ENCOUNTER — Ambulatory Visit: Admitting: Orthopedic Surgery
